# Patient Record
Sex: Female | Born: 1980
Health system: Southern US, Community
[De-identification: ages and names within clinical notes are randomized; demographics above are authoritative.]

## PROBLEM LIST (undated history)

## (undated) DIAGNOSIS — B20 Human immunodeficiency virus [HIV] disease: Secondary | ICD-10-CM

## (undated) HISTORY — DX: Human immunodeficiency virus (HIV) disease: B20

## (undated) HISTORY — PX: NO PAST SURGERIES: SHX2092

---

## 2015-11-15 ENCOUNTER — Other Ambulatory Visit: Payer: Self-pay | Admitting: Infectious Disease

## 2015-11-15 ENCOUNTER — Ambulatory Visit
Admission: RE | Admit: 2015-11-15 | Discharge: 2015-11-15 | Disposition: A | Discharge status: Home / Self Care | Payer: No Typology Code available for payment source | Source: Ambulatory Visit | Attending: Infectious Disease | Admitting: Infectious Disease

## 2015-11-15 DIAGNOSIS — Z139 Encounter for screening, unspecified: Secondary | ICD-10-CM

## 2018-08-12 DIAGNOSIS — B20 Human immunodeficiency virus [HIV] disease: Secondary | ICD-10-CM | POA: Diagnosis not present

## 2018-10-04 DIAGNOSIS — Z3200 Encounter for pregnancy test, result unknown: Secondary | ICD-10-CM | POA: Diagnosis not present

## 2018-10-04 DIAGNOSIS — Z3009 Encounter for other general counseling and advice on contraception: Secondary | ICD-10-CM | POA: Diagnosis not present

## 2018-10-17 DIAGNOSIS — Z3482 Encounter for supervision of other normal pregnancy, second trimester: Secondary | ICD-10-CM | POA: Diagnosis not present

## 2018-10-17 DIAGNOSIS — O98719 Human immunodeficiency virus [HIV] disease complicating pregnancy, unspecified trimester: Secondary | ICD-10-CM | POA: Diagnosis not present

## 2018-10-17 DIAGNOSIS — Z0389 Encounter for observation for other suspected diseases and conditions ruled out: Secondary | ICD-10-CM | POA: Diagnosis not present

## 2018-10-17 DIAGNOSIS — Z23 Encounter for immunization: Secondary | ICD-10-CM | POA: Diagnosis not present

## 2018-10-17 DIAGNOSIS — Z1388 Encounter for screening for disorder due to exposure to contaminants: Secondary | ICD-10-CM | POA: Diagnosis not present

## 2018-10-17 DIAGNOSIS — Z789 Other specified health status: Secondary | ICD-10-CM | POA: Diagnosis not present

## 2018-10-17 DIAGNOSIS — Z3009 Encounter for other general counseling and advice on contraception: Secondary | ICD-10-CM | POA: Diagnosis not present

## 2018-10-17 LAB — OB RESULTS CONSOLE HGB/HCT, BLOOD
HEMATOCRIT: 38 (ref 29–41)
Hemoglobin: 12.1

## 2018-10-17 LAB — OB RESULTS CONSOLE ABO/RH: RH TYPE: POSITIVE

## 2018-10-17 LAB — DRUG SCREEN, URINE: Drug Screen, Urine: NEGATIVE

## 2018-10-17 LAB — CYSTIC FIBROSIS MUTATION 97: Cystic Fibrosis Mutat: NEGATIVE

## 2018-10-17 LAB — CYTOLOGY - PAP: Pap: NEGATIVE

## 2018-10-17 LAB — GLUCOSE, 1 HOUR: Glucose 1 Hour: 80

## 2018-10-17 LAB — OB RESULTS CONSOLE ANTIBODY SCREEN: Antibody Screen: NEGATIVE

## 2018-10-17 LAB — OB RESULTS CONSOLE PLATELET COUNT: Platelets: 239

## 2018-10-17 LAB — OB RESULTS CONSOLE GC/CHLAMYDIA
CHLAMYDIA, DNA PROBE: NEGATIVE
GC PROBE AMP, GENITAL: NEGATIVE

## 2018-10-17 LAB — CULTURE, OB URINE: Urine Culture, OB: NO GROWTH

## 2018-10-17 LAB — OB RESULTS CONSOLE RPR: RPR: NONREACTIVE

## 2018-11-01 ENCOUNTER — Encounter: Payer: Self-pay | Admitting: *Deleted

## 2018-11-03 ENCOUNTER — Encounter: Payer: Self-pay | Admitting: Obstetrics and Gynecology

## 2018-11-03 ENCOUNTER — Encounter: Payer: Self-pay | Admitting: *Deleted

## 2018-11-03 ENCOUNTER — Ambulatory Visit (INDEPENDENT_AMBULATORY_CARE_PROVIDER_SITE_OTHER): Payer: Medicaid Other | Admitting: Obstetrics and Gynecology

## 2018-11-03 VITALS — BP 98/80 | HR 67 | Ht 65.5 in | Wt 169.7 lb

## 2018-11-03 DIAGNOSIS — O09899 Supervision of other high risk pregnancies, unspecified trimester: Secondary | ICD-10-CM | POA: Insufficient documentation

## 2018-11-03 DIAGNOSIS — O0990 Supervision of high risk pregnancy, unspecified, unspecified trimester: Secondary | ICD-10-CM

## 2018-11-03 DIAGNOSIS — Z789 Other specified health status: Secondary | ICD-10-CM | POA: Insufficient documentation

## 2018-11-03 DIAGNOSIS — O09892 Supervision of other high risk pregnancies, second trimester: Secondary | ICD-10-CM

## 2018-11-03 DIAGNOSIS — O09522 Supervision of elderly multigravida, second trimester: Secondary | ICD-10-CM

## 2018-11-03 DIAGNOSIS — O09529 Supervision of elderly multigravida, unspecified trimester: Secondary | ICD-10-CM | POA: Insufficient documentation

## 2018-11-03 NOTE — Progress Notes (Signed)
Referral form sent to [facility].

## 2018-11-03 NOTE — Patient Instructions (Signed)
 Second Trimester of Pregnancy The second trimester is from week 14 through week 27 (months 4 through 6). The second trimester is often a time when you feel your best. Your body has adjusted to being pregnant, and you begin to feel better physically. Usually, morning sickness has lessened or quit completely, you may have more energy, and you may have an increase in appetite. The second trimester is also a time when the fetus is growing rapidly. At the end of the sixth month, the fetus is about 9 inches long and weighs about 1 pounds. You will likely begin to feel the baby move (quickening) between 16 and 20 weeks of pregnancy. Body changes during your second trimester Your body continues to go through many changes during your second trimester. The changes vary from woman to woman.  Your weight will continue to increase. You will notice your lower abdomen bulging out.  You may begin to get stretch marks on your hips, abdomen, and breasts.  You may develop headaches that can be relieved by medicines. The medicines should be approved by your health care provider.  You may urinate more often because the fetus is pressing on your bladder.  You may develop or continue to have heartburn as a result of your pregnancy.  You may develop constipation because certain hormones are causing the muscles that push waste through your intestines to slow down.  You may develop hemorrhoids or swollen, bulging veins (varicose veins).  You may have back pain. This is caused by: ? Weight gain. ? Pregnancy hormones that are relaxing the joints in your pelvis. ? A shift in weight and the muscles that support your balance.  Your breasts will continue to grow and they will continue to become tender.  Your gums may bleed and may be sensitive to brushing and flossing.  Dark spots or blotches (chloasma, mask of pregnancy) may develop on your face. This will likely fade after the baby is born.  A dark line from  your belly button to the pubic area (linea nigra) may appear. This will likely fade after the baby is born.  You may have changes in your hair. These can include thickening of your hair, rapid growth, and changes in texture. Some women also have hair loss during or after pregnancy, or hair that feels dry or thin. Your hair will most likely return to normal after your baby is born. What to expect at prenatal visits During a routine prenatal visit:  You will be weighed to make sure you and the fetus are growing normally.  Your blood pressure will be taken.  Your abdomen will be measured to track your baby's growth.  The fetal heartbeat will be listened to.  Any test results from the previous visit will be discussed. Your health care provider may ask you:  How you are feeling.  If you are feeling the baby move.  If you have had any abnormal symptoms, such as leaking fluid, bleeding, severe headaches, or abdominal cramping.  If you are using any tobacco products, including cigarettes, chewing tobacco, and electronic cigarettes.  If you have any questions. Other tests that may be performed during your second trimester include:  Blood tests that check for: ? Low iron levels (anemia). ? High blood sugar that affects pregnant women (gestational diabetes) between 24 and 28 weeks. ? Rh antibodies. This is to check for a protein on red blood cells (Rh factor).  Urine tests to check for infections, diabetes, or protein in   the urine.  An ultrasound to confirm the proper growth and development of the baby.  An amniocentesis to check for possible genetic problems.  Fetal screens for spina bifida and Down syndrome.  HIV (human immunodeficiency virus) testing. Routine prenatal testing includes screening for HIV, unless you choose not to have this test. Follow these instructions at home: Medicines  Follow your health care provider's instructions regarding medicine use. Specific medicines  may be either safe or unsafe to take during pregnancy.  Take a prenatal vitamin that contains at least 600 micrograms (mcg) of folic acid.  If you develop constipation, try taking a stool softener if your health care provider approves. Eating and drinking   Eat a balanced diet that includes fresh fruits and vegetables, whole grains, good sources of protein such as meat, eggs, or tofu, and low-fat dairy. Your health care provider will help you determine the amount of weight gain that is right for you.  Avoid raw meat and uncooked cheese. These carry germs that can cause birth defects in the baby.  If you have low calcium intake from food, talk to your health care provider about whether you should take a daily calcium supplement.  Limit foods that are high in fat and processed sugars, such as fried and sweet foods.  To prevent constipation: ? Drink enough fluid to keep your urine clear or pale yellow. ? Eat foods that are high in fiber, such as fresh fruits and vegetables, whole grains, and beans. Activity  Exercise only as directed by your health care provider. Most women can continue their usual exercise routine during pregnancy. Try to exercise for 30 minutes at least 5 days a week. Stop exercising if you experience uterine contractions.  Avoid heavy lifting, wear low heel shoes, and practice good posture.  A sexual relationship may be continued unless your health care provider directs you otherwise. Relieving pain and discomfort  Wear a good support bra to prevent discomfort from breast tenderness.  Take warm sitz baths to soothe any pain or discomfort caused by hemorrhoids. Use hemorrhoid cream if your health care provider approves.  Rest with your legs elevated if you have leg cramps or low back pain.  If you develop varicose veins, wear support hose. Elevate your feet for 15 minutes, 3-4 times a day. Limit salt in your diet. Prenatal Care  Write down your questions. Take  them to your prenatal visits.  Keep all your prenatal visits as told by your health care provider. This is important. Safety  Wear your seat belt at all times when driving.  Make a list of emergency phone numbers, including numbers for family, friends, the hospital, and police and fire departments. General instructions  Ask your health care provider for a referral to a local prenatal education class. Begin classes no later than the beginning of month 6 of your pregnancy.  Ask for help if you have counseling or nutritional needs during pregnancy. Your health care provider can offer advice or refer you to specialists for help with various needs.  Do not use hot tubs, steam rooms, or saunas.  Do not douche or use tampons or scented sanitary pads.  Do not cross your legs for long periods of time.  Avoid cat litter boxes and soil used by cats. These carry germs that can cause birth defects in the baby and possibly loss of the fetus by miscarriage or stillbirth.  Avoid all smoking, herbs, alcohol, and unprescribed drugs. Chemicals in these products can affect the   formation and growth of the baby.  Do not use any products that contain nicotine or tobacco, such as cigarettes and e-cigarettes. If you need help quitting, ask your health care provider.  Visit your dentist if you have not gone yet during your pregnancy. Use a soft toothbrush to brush your teeth and be gentle when you floss. Contact a health care provider if:  You have dizziness.  You have mild pelvic cramps, pelvic pressure, or nagging pain in the abdominal area.  You have persistent nausea, vomiting, or diarrhea.  You have a bad smelling vaginal discharge.  You have pain when you urinate. Get help right away if:  You have a fever.  You are leaking fluid from your vagina.  You have spotting or bleeding from your vagina.  You have severe abdominal cramping or pain.  You have rapid weight gain or weight loss.  You  have shortness of breath with chest pain.  You notice sudden or extreme swelling of your face, hands, ankles, feet, or legs.  You have not felt your baby move in over an hour.  You have severe headaches that do not go away when you take medicine.  You have vision changes. Summary  The second trimester is from week 14 through week 27 (months 4 through 6). It is also a time when the fetus is growing rapidly.  Your body goes through many changes during pregnancy. The changes vary from woman to woman.  Avoid all smoking, herbs, alcohol, and unprescribed drugs. These chemicals affect the formation and growth your baby.  Do not use any tobacco products, such as cigarettes, chewing tobacco, and e-cigarettes. If you need help quitting, ask your health care provider.  Contact your health care provider if you have any questions. Keep all prenatal visits as told by your health care provider. This is important. This information is not intended to replace advice given to you by your health care provider. Make sure you discuss any questions you have with your health care provider. Document Released: 10/06/2001 Document Revised: 11/17/2016 Document Reviewed: 11/17/2016 Elsevier Interactive Patient Education  2019 [employer].   Contraception Choices Contraception, also called birth control, refers to methods or devices that prevent pregnancy. Hormonal methods Contraceptive implant  A contraceptive implant is a thin, plastic tube that contains a hormone. It is inserted into the upper part of the arm. It can remain in place for up to 3 years. Progestin-only injections Progestin-only injections are injections of progestin, a synthetic form of the hormone progesterone. They are given every 3 months by a health care provider. Birth control pills  Birth control pills are pills that contain hormones that prevent pregnancy. They must be taken once a day, preferably at the same time each day. Birth  control patch  The birth control patch contains hormones that prevent pregnancy. It is placed on the skin and must be changed once a week for three weeks and removed on the fourth week. A prescription is needed to use this method of contraception. Vaginal ring  A vaginal ring contains hormones that prevent pregnancy. It is placed in the vagina for three weeks and removed on the fourth week. After that, the process is repeated with a new ring. A prescription is needed to use this method of contraception. Emergency contraceptive Emergency contraceptives prevent pregnancy after unprotected sex. They come in pill form and can be taken up to 5 days after sex. They work best the sooner they are taken after having sex. Most  emergency contraceptives are available without a prescription. This method should not be used as your only form of birth control. Barrier methods Female condom  A female condom is a thin sheath that is worn over the penis during sex. Condoms keep sperm from going inside a woman's body. They can be used with a spermicide to increase their effectiveness. They should be disposed after a single use. Female condom  A female condom is a soft, loose-fitting sheath that is put into the vagina before sex. The condom keeps sperm from going inside a woman's body. They should be disposed after a single use. Diaphragm  A diaphragm is a soft, dome-shaped barrier. It is inserted into the vagina before sex, along with a spermicide. The diaphragm blocks sperm from entering the uterus, and the spermicide kills sperm. A diaphragm should be left in the vagina for 6-8 hours after sex and removed within 24 hours. A diaphragm is prescribed and fitted by a health care provider. A diaphragm should be replaced every 1-2 years, after giving birth, after gaining more than 15 lb (6.8 kg), and after pelvic surgery. Cervical cap  A cervical cap is a round, soft latex or plastic cup that fits over the cervix. It is  inserted into the vagina before sex, along with spermicide. It blocks sperm from entering the uterus. The cap should be left in place for 6-8 hours after sex and removed within 48 hours. A cervical cap must be prescribed and fitted by a health care provider. It should be replaced every 2 years. Sponge  A sponge is a soft, circular piece of polyurethane foam with spermicide on it. The sponge helps block sperm from entering the uterus, and the spermicide kills sperm. To use it, you make it wet and then insert it into the vagina. It should be inserted before sex, left in for at least 6 hours after sex, and removed and thrown away within 30 hours. Spermicides Spermicides are chemicals that kill or block sperm from entering the cervix and uterus. They can come as a cream, jelly, suppository, foam, or tablet. A spermicide should be inserted into the vagina with an applicator at least 93-26 minutes before sex to allow time for it to work. The process must be repeated every time you have sex. Spermicides do not require a prescription. Intrauterine contraception Intrauterine device (IUD) An IUD is a T-shaped device that is put in a woman's uterus. There are two types:  Hormone IUD.This type contains progestin, a synthetic form of the hormone progesterone. This type can stay in place for 3-5 years.  Copper IUD.This type is wrapped in copper wire. It can stay in place for 10 years.  Permanent methods of contraception Female tubal ligation In this method, a woman's fallopian tubes are sealed, tied, or blocked during surgery to prevent eggs from traveling to the uterus. Hysteroscopic sterilization In this method, a small, flexible insert is placed into each fallopian tube. The inserts cause scar tissue to form in the fallopian tubes and block them, so sperm cannot reach an egg. The procedure takes about 3 months to be effective. Another form of birth control must be used during those 3 months. Female  sterilization This is a procedure to tie off the tubes that carry sperm (vasectomy). After the procedure, the man can still ejaculate fluid (semen). Natural planning methods Natural family planning In this method, a couple does not have sex on days when the woman could become pregnant. Calendar method This means keeping  wire. It can stay in place for 10 years.    Permanent methods of contraception  Female tubal ligation  In this method, a woman's fallopian tubes are sealed, tied, or blocked during surgery to prevent eggs from traveling to the uterus.  Hysteroscopic sterilization  In this method, a small, flexible insert is placed into each fallopian tube. The inserts cause scar tissue to form in the fallopian tubes and block them, so sperm cannot reach an egg. The procedure takes about 3 months to be effective. Another form of birth control must be used during those 3 months.  Female sterilization  This  is a procedure to tie off the tubes that carry sperm (vasectomy). After the procedure, the man can still ejaculate fluid (semen).  Natural planning methods  Natural family planning  In this method, a couple does not have sex on days when the woman could become pregnant.  Calendar method  This means keeping track of the length of each menstrual cycle, identifying the days when pregnancy can happen, and not having sex on those days.  Ovulation method  In this method, a couple avoids sex during ovulation.  Symptothermal method  This method involves not having sex during ovulation. The woman typically checks for ovulation by watching changes in her temperature and in the consistency of cervical mucus.  Post-ovulation method  In this method, a couple waits to have sex until after ovulation.  Summary  · Contraception, also called birth control, means methods or devices that prevent pregnancy.  · Hormonal methods of contraception include implants, injections, pills, patches, vaginal rings, and emergency contraceptives.  · Barrier methods of contraception can include female condoms, female condoms, diaphragms, cervical caps, sponges, and spermicides.  · There are two types of IUDs (intrauterine devices). An IUD can be put in a woman's uterus to prevent pregnancy for 3-5 years.  · Permanent sterilization can be done through a procedure for males, females, or both.  · Natural family planning methods involve not having sex on days when the woman could become pregnant.  This information is not intended to replace advice given to you by your health care provider. Make sure you discuss any questions you have with your health care provider.  Document Released: 10/12/2005 Document Revised: 10/14/2017 Document Reviewed: 11/14/2016  Elsevier Interactive Patient Education © 2019 [employer] Inc[employer]

## 2018-11-03 NOTE — Progress Notes (Signed)
Anatomy ultrasound and MFM consult scheduled for 11/17/18 @ 0800.

## 2018-11-03 NOTE — Progress Notes (Signed)
  Subjective:    Marcia Long is a [id number] [redacted]w[redacted]d being seen today for her first obstetrical visit.  Her obstetrical history is significant for AMA, grand multiparity HIV followed in [facility] (patient last seen 6 month ago). Patient does not intend to breast feed. Pregnancy history fully reviewed.  Patient reports no complaints.  Vitals:   11/03/18 1459 11/03/18 1502  BP: 98/80   Pulse: 67   Weight: 169 lb 11.2 oz (77 kg)   Height:  5' 5.5" (1.664 m)    HISTORY: OB History  Gravida Para Term Preterm AB Living  6 5 5     5   SAB TAB Ectopic Multiple Live Births          5    # Outcome Date GA Lbr Len/2nd Weight Sex Delivery Anes PTL Lv  6 Current           5 Term 10/22/14     Vag-Spont   LIV  4 Term 08/03/11     Vag-Spont   LIV  3 Term 05/02/08     Vag-Spont   LIV  2 Term 10/23/07     Vag-Spont   LIV  1 Term 03/18/05     Vag-Spont   LIV   Past Medical History:  Diagnosis Date  . HIV (human immunodeficiency virus infection) (HCC)    History reviewed. No pertinent surgical history. History reviewed. No pertinent family history.   Exam      Assessment:    Pregnancy: [id number] Patient Active Problem List   Diagnosis Date Noted  . Supervision of high risk pregnancy, antepartum 11/03/2018  . HIV (human immunodeficiency virus) risk factors complicating pregnancy 11/03/2018  . Language barrier 11/03/2018  . AMA (advanced maternal age) multigravida 35+ 11/03/2018        Plan:     Initial labs drawn. Prenatal vitamins. Problem list reviewed and updated. Genetic Screening discussed : patient with abnormal quad screen. Panorama today  Ultrasound discussed; fetal survey: ordered, along with genetic counseling Patient instructed to contact ID physician to inform of pregnancy status. Patient desires to transfer care to ID in [facility] due to transportation Patient desires permanent sterilization  Follow up in 4 weeks. 50% of 30 min visit spent on counseling and  coordination of care.     Marcia Long 11/03/2018

## 2018-11-07 ENCOUNTER — Encounter: Payer: Self-pay | Admitting: *Deleted

## 2018-11-10 ENCOUNTER — Encounter (HOSPITAL_COMMUNITY): Payer: Self-pay

## 2018-11-15 ENCOUNTER — Other Ambulatory Visit: Payer: Medicaid Other

## 2018-11-15 ENCOUNTER — Other Ambulatory Visit (HOSPITAL_COMMUNITY)
Admission: RE | Admit: 2018-11-15 | Discharge: 2018-11-15 | Disposition: A | Discharge status: Home / Self Care | Payer: Medicaid Other | Source: Ambulatory Visit | Attending: Infectious Diseases | Admitting: Infectious Diseases

## 2018-11-15 ENCOUNTER — Ambulatory Visit: Payer: Medicaid Other

## 2018-11-15 ENCOUNTER — Encounter: Payer: Self-pay | Admitting: Family

## 2018-11-15 ENCOUNTER — Other Ambulatory Visit: Payer: Self-pay

## 2018-11-15 DIAGNOSIS — Z79899 Other long term (current) drug therapy: Secondary | ICD-10-CM

## 2018-11-15 DIAGNOSIS — Z113 Encounter for screening for infections with a predominantly sexual mode of transmission: Secondary | ICD-10-CM | POA: Diagnosis not present

## 2018-11-15 DIAGNOSIS — B20 Human immunodeficiency virus [HIV] disease: Secondary | ICD-10-CM | POA: Diagnosis not present

## 2018-11-15 LAB — URINALYSIS
Bilirubin Urine: NEGATIVE
GLUCOSE, UA: NEGATIVE
Hgb urine dipstick: NEGATIVE
Ketones, ur: NEGATIVE
Leukocytes, UA: NEGATIVE
Nitrite: NEGATIVE
Protein, ur: NEGATIVE
Specific Gravity, Urine: 1.01 (ref 1.001–1.03)
pH: 7 (ref 5.0–8.0)

## 2018-11-16 LAB — URINE CYTOLOGY ANCILLARY ONLY
Chlamydia: NEGATIVE
Neisseria Gonorrhea: NEGATIVE

## 2018-11-17 ENCOUNTER — Ambulatory Visit (HOSPITAL_COMMUNITY): Admission: RE | Admit: 2018-11-17 | Payer: Medicaid Other | Source: Ambulatory Visit

## 2018-11-17 ENCOUNTER — Encounter (HOSPITAL_COMMUNITY): Payer: Self-pay

## 2018-11-17 ENCOUNTER — Other Ambulatory Visit (HOSPITAL_COMMUNITY): Payer: Self-pay | Admitting: *Deleted

## 2018-11-17 ENCOUNTER — Ambulatory Visit (HOSPITAL_COMMUNITY)
Admission: RE | Admit: 2018-11-17 | Discharge: 2018-11-17 | Disposition: A | Discharge status: Home / Self Care | Payer: Medicaid Other | Source: Ambulatory Visit | Attending: Obstetrics and Gynecology | Admitting: Obstetrics and Gynecology

## 2018-11-17 DIAGNOSIS — Z3A25 25 weeks gestation of pregnancy: Secondary | ICD-10-CM | POA: Diagnosis not present

## 2018-11-17 DIAGNOSIS — Z362 Encounter for other antenatal screening follow-up: Secondary | ICD-10-CM

## 2018-11-17 DIAGNOSIS — O0932 Supervision of pregnancy with insufficient antenatal care, second trimester: Secondary | ICD-10-CM

## 2018-11-17 DIAGNOSIS — O09522 Supervision of elderly multigravida, second trimester: Secondary | ICD-10-CM | POA: Insufficient documentation

## 2018-11-17 DIAGNOSIS — O98712 Human immunodeficiency virus [HIV] disease complicating pregnancy, second trimester: Secondary | ICD-10-CM | POA: Diagnosis not present

## 2018-11-17 DIAGNOSIS — Z363 Encounter for antenatal screening for malformations: Secondary | ICD-10-CM

## 2018-11-17 DIAGNOSIS — O289 Unspecified abnormal findings on antenatal screening of mother: Secondary | ICD-10-CM | POA: Diagnosis not present

## 2018-11-17 DIAGNOSIS — O09892 Supervision of other high risk pregnancies, second trimester: Secondary | ICD-10-CM | POA: Diagnosis not present

## 2018-11-17 LAB — T-HELPER CELL (CD4) - (RCID CLINIC ONLY)
CD4 % Helper T Cell: 43 % (ref 33–55)
CD4 T CELL ABS: 800 /uL (ref 400–2700)

## 2018-11-17 NOTE — ED Notes (Signed)
[place] interpreter unavailable, (live, video or audio) for visit.

## 2018-11-23 LAB — CBC WITH DIFFERENTIAL/PLATELET
Absolute Monocytes: 515 cells/uL (ref 200–950)
Basophils Absolute: 42 cells/uL (ref 0–200)
Basophils Relative: 0.8 %
Eosinophils Absolute: 270 cells/uL (ref 15–500)
Eosinophils Relative: 5.2 %
HCT: 38.7 % (ref 35.0–45.0)
Hemoglobin: 12.9 g/dL (ref 11.7–15.5)
Lymphs Abs: 1789 cells/uL (ref 850–3900)
MCH: 29.4 pg (ref 27.0–33.0)
MCHC: 33.3 g/dL (ref 32.0–36.0)
MCV: 88.2 fL (ref 80.0–100.0)
MPV: 11.3 fL (ref 7.5–12.5)
Monocytes Relative: 9.9 %
NEUTROS PCT: 49.7 %
Neutro Abs: 2584 cells/uL (ref 1500–7800)
Platelets: 219 10*3/uL (ref 140–400)
RBC: 4.39 10*6/uL (ref 3.80–5.10)
RDW: 13.5 % (ref 11.0–15.0)
TOTAL LYMPHOCYTE: 34.4 %
WBC: 5.2 10*3/uL (ref 3.8–10.8)

## 2018-11-23 LAB — HEPATITIS B CORE ANTIBODY, TOTAL: Hep B Core Total Ab: REACTIVE — AB

## 2018-11-23 LAB — QUANTIFERON-TB GOLD PLUS
Mitogen-NIL: >10 IU/mL
NIL: 0.09 IU/mL
QuantiFERON-TB Gold Plus: NEGATIVE
TB1-NIL: 0.1 IU/mL
TB2-NIL: 0.09 IU/mL

## 2018-11-23 LAB — HIV-1 RNA ULTRAQUANT REFLEX TO GENTYP+
HIV 1 RNA Quant: <20 copies/mL
HIV-1 RNA Quant, Log: <1.3 Log copies/mL

## 2018-11-23 LAB — COMPLETE METABOLIC PANEL WITH GFR
AG RATIO: 1.1 (calc) (ref 1.0–2.5)
ALT: 15 U/L (ref 6–29)
AST: 12 U/L (ref 10–30)
Albumin: 3.5 g/dL — ABNORMAL LOW (ref 3.6–5.1)
Alkaline phosphatase (APISO): 44 U/L (ref 33–115)
BUN: 8 mg/dL (ref 7–25)
CO2: 26 mmol/L (ref 20–32)
Calcium: 8.9 mg/dL (ref 8.6–10.2)
Chloride: 105 mmol/L (ref 98–110)
Creat: 0.63 mg/dL (ref 0.50–1.10)
GFR, Est African American: 132 mL/min/{1.73_m2} (ref >=60)
GFR, Est Non African American: 114 mL/min/{1.73_m2} (ref >=60)
Globulin: 3.2 g/dL (calc) (ref 1.9–3.7)
Glucose, Bld: 78 mg/dL (ref 65–99)
POTASSIUM: 4 mmol/L (ref 3.5–5.3)
Sodium: 138 mmol/L (ref 135–146)
Total Bilirubin: 0.3 mg/dL (ref 0.2–1.2)
Total Protein: 6.7 g/dL (ref 6.1–8.1)

## 2018-11-23 LAB — RPR: RPR Ser Ql: NONREACTIVE

## 2018-11-23 LAB — HEPATITIS B SURFACE ANTIBODY,QUALITATIVE: Hep B S Ab: REACTIVE — AB

## 2018-11-23 LAB — HLA B*5701: HLA-B*5701 w/rflx HLA-B High: NEGATIVE

## 2018-11-23 LAB — LIPID PANEL
Cholesterol: 185 mg/dL (ref <200)
HDL: 75 mg/dL (ref >50)
LDL Cholesterol (Calc): 92 mg/dL (calc)
Non-HDL Cholesterol (Calc): 110 mg/dL (calc) (ref <130)
Total CHOL/HDL Ratio: 2.5 (calc) (ref <5.0)
Triglycerides: 88 mg/dL (ref <150)

## 2018-11-23 LAB — HEPATITIS B SURFACE ANTIGEN: HEP B S AG: NONREACTIVE

## 2018-11-23 LAB — HEPATITIS A ANTIBODY, TOTAL: Hepatitis A AB,Total: REACTIVE — AB

## 2018-11-23 LAB — HIV-1/2 AB - DIFFERENTIATION
HIV-1 antibody: POSITIVE — AB
HIV-2 Ab: NEGATIVE

## 2018-11-23 LAB — HIV ANTIBODY (ROUTINE TESTING W REFLEX): HIV 1&2 Ab, 4th Generation: REACTIVE — AB

## 2018-11-23 LAB — HEPATITIS C ANTIBODY
Hepatitis C Ab: NONREACTIVE
SIGNAL TO CUT-OFF: 0.03 (ref <1.00)

## 2018-11-25 DIAGNOSIS — B20 Human immunodeficiency virus [HIV] disease: Secondary | ICD-10-CM | POA: Diagnosis not present

## 2018-11-28 ENCOUNTER — Telehealth: Payer: Self-pay | Admitting: *Deleted

## 2018-11-28 NOTE — Telephone Encounter (Signed)
Pt left message stating her name and DOB.  She did not state the reason for her call.

## 2018-11-29 NOTE — Telephone Encounter (Signed)
Called patient twice phone rang no answer.

## 2018-11-30 ENCOUNTER — Ambulatory Visit (INDEPENDENT_AMBULATORY_CARE_PROVIDER_SITE_OTHER): Payer: Medicaid Other | Admitting: Pharmacist

## 2018-11-30 ENCOUNTER — Encounter: Payer: Self-pay | Admitting: Infectious Diseases

## 2018-11-30 ENCOUNTER — Ambulatory Visit (INDEPENDENT_AMBULATORY_CARE_PROVIDER_SITE_OTHER): Payer: Medicaid Other | Admitting: Infectious Diseases

## 2018-11-30 VITALS — BP 94/55 | HR 61 | Temp 97.8°F | Wt 172.0 lb

## 2018-11-30 DIAGNOSIS — B20 Human immunodeficiency virus [HIV] disease: Secondary | ICD-10-CM

## 2018-11-30 DIAGNOSIS — Z79899 Other long term (current) drug therapy: Secondary | ICD-10-CM | POA: Diagnosis not present

## 2018-11-30 DIAGNOSIS — R3915 Urgency of urination: Secondary | ICD-10-CM

## 2018-11-30 DIAGNOSIS — Z21 Asymptomatic human immunodeficiency virus [HIV] infection status: Secondary | ICD-10-CM | POA: Diagnosis not present

## 2018-11-30 DIAGNOSIS — S025XXS Fracture of tooth (traumatic), sequela: Secondary | ICD-10-CM | POA: Diagnosis not present

## 2018-11-30 DIAGNOSIS — Z3A00 Weeks of gestation of pregnancy not specified: Secondary | ICD-10-CM

## 2018-11-30 DIAGNOSIS — R011 Cardiac murmur, unspecified: Secondary | ICD-10-CM | POA: Diagnosis not present

## 2018-11-30 DIAGNOSIS — O98712 Human immunodeficiency virus [HIV] disease complicating pregnancy, second trimester: Secondary | ICD-10-CM | POA: Diagnosis not present

## 2018-11-30 DIAGNOSIS — O09892 Supervision of other high risk pregnancies, second trimester: Secondary | ICD-10-CM

## 2018-11-30 DIAGNOSIS — K0889 Other specified disorders of teeth and supporting structures: Secondary | ICD-10-CM | POA: Diagnosis not present

## 2018-11-30 DIAGNOSIS — G8929 Other chronic pain: Secondary | ICD-10-CM | POA: Insufficient documentation

## 2018-11-30 DIAGNOSIS — K089 Disorder of teeth and supporting structures, unspecified: Secondary | ICD-10-CM

## 2018-11-30 HISTORY — DX: Asymptomatic human immunodeficiency virus (hiv) infection status: Z21

## 2018-11-30 HISTORY — DX: Human immunodeficiency virus (HIV) disease: B20

## 2018-11-30 MED ORDER — EMTRICITABINE-TENOFOVIR DF 200-300 MG PO TABS: 1.0000 | ORAL_TABLET | Freq: Every day | ORAL | 11 refills | Status: DC

## 2018-11-30 MED ORDER — DOLUTEGRAVIR SODIUM 50 MG PO TABS: 50.0000 mg | ORAL_TABLET | Freq: Every day | ORAL | 11 refills | Status: DC

## 2018-11-30 NOTE — Assessment & Plan Note (Addendum)
New patient here to establish for HIV care. I discussed with Marcia Long treatment options/side effects, benefits of treatment and long-term outcomes. I discussed how HIV is transmitted and the process of untreated HIV including increased risk for opportunistic infections, cancer, dementia and renal failure. Patient was counseled on routine HIV care including medication adherence, blood monitoring, necessary vaccines and follow up visits. Patient spent time talking with our pharmacist Cassie regarding successful practices of ART and understands to reach out to our clinic in the future with questions.  We reviewed pregnancy recommendations today and with switch her Odefsey to Tivicay + Truvada once daily for HIV treatment. She has Medicaid - will send to [facility] per request to mail medications. Discussed at length recommendation to formula feed over nurse her child. Will again discuss at upcoming visit. If she does desire to breastfeed still will discuss harm reduction practices further and infant antiretroviral prophylaxis throughout breastfeeding. Presumed risk for acquisition is statistically very low 0.3 - 0.6% transition over 12 months of nursing in the setting of maternal viral suppression and infant ARV proph. I think language/cultural barrier is making this difficult to convince her otherwise.   General introduction to our clinic and integrated services. Introduced to [facility] and [facility] today. Dental referral placed today for [facility]. Information to schedule appointment completed today.   I spent greater than 60 minutes with the patient today. Greater than 70% of the time spent face-to-face counseling and coordination of care re: HIV and health maintenance.

## 2018-11-30 NOTE — Telephone Encounter (Signed)
Returned patients call, because she previoulsy called and left her name and date of birth, patient wanted to know when her appointment was advised her it was tomorrow at 335pm, patient said okay and ended the call

## 2018-11-30 NOTE — Assessment & Plan Note (Signed)
With chart review seems her teeth have been a problem off and on for a few years. She has multiple broken teeth and with current area of complaint a large hole in central molar likely extending down to nerve. There is no acute evidence of infection or abscess presently. Being pregnant complicates this for her I would presume. Discussed cold application to mandible and tylenol. She needs multiple extractions. Will have her set up with our dental team. Language barrier in the past has been a problem to arrange services from what I see.

## 2018-11-30 NOTE — Assessment & Plan Note (Signed)
She is undetectable. Will have her return in 6 week to repeat HIV viral load with med switch. She will return at 34-36 weeks to repeat viral load again to provide reassurance that she can progress with vaginal birth.

## 2018-11-30 NOTE — Progress Notes (Signed)
Name: Marcia Marcia Long  DOB: 12/28/1980 MRN: [id number] PCP: Patient, No Pcp Per    Patient Active Problem List   Diagnosis Date Noted  . HIV (human immunodeficiency virus infection) (HCC) 11/30/2018  . Chronic dental pain 11/30/2018  . Supervision of high risk pregnancy, antepartum 11/03/2018  . HIV (human immunodeficiency virus) risk factors complicating pregnancy 11/03/2018  . Language barrier 11/03/2018  . AMA (advanced maternal age) multigravida 35+ 11/03/2018     Brief Narrative:  Marcia Marcia Long is a 38 y.o. female with HIV disease, dx 2006. [place] 07[place] as refugee. Native language [place]. [facility]. Married, husband HIV+. A refugee. History of OIs: unknown.  Previous Regimens: . Odefsey 2017 >> undetectable  Genotypes: . 2017 chart records indicate no major RT, PI, INSTI mutations  Subjective:  CC:  Establish / transfer HIV care. Currently pregnant in 2nd trimester. Has left tooth pain.   HPI:  Interpretor present during visit.  Currently taking her Odefsey with food daily. She has been on this medication at least since 2017. Has been out x 5d due to delay in shipping medication. She likes the one pill option and has no concern with s/e of medication. She has been on a few other pills in the past but does not recall regimen given to her in [place]. Reports no complaints today suggestive of associated opportunistic infection or advancing HIV disease such as fevers, night sweats, weight loss, anorexia, cough, SOB, nausea, vomiting, diarrhea, headache, sensory changes, lymphadenopathy or oral thrush.    Currently pregnant with 6th child. Appears to be a female per recent ultrasound. She is in her second trimester with EDD 03/01/2019. Not currently taking prenatal vitamins - she tells me that she was told she needs a prescription for this. She is not certain who her doctor is and uncertain as to what her due  date is. She would like to know the gender of her baby as she has not been informed following recent ultrasound. She has 5 daughters at home (per chart one may be HIV+ but she does not offer this). She has had all vaginal deliveries in the past. She plans to breast feed and supplement with formula - she says she was told to not breast feed however there was a "delay in receiving formula" and she had to feed her child.   Complains of tooth pain. Trouble with cold and hot foods/fluids. Lower left jaw. No fevers, drainage or swelling. No access to dentist. Uncertain as to last pap smear. Not sure if she has had abnormal testing in the past.   Review of Systems  Constitutional: Negative for chills, fever, malaise/fatigue and weight loss.  HENT: Negative for sore throat.        Left lower jaw tooth pain  Respiratory: Negative for cough, sputum production and shortness of breath.   Cardiovascular: Negative.   Gastrointestinal: Negative for abdominal pain, diarrhea and vomiting.  Genitourinary: Positive for urgency. Negative for dysuria.  Musculoskeletal: Negative for joint pain, myalgias and neck pain.  Skin: Negative for rash.  Neurological: Negative for headaches.    Past Medical History:  Diagnosis Date  . HIV (human immunodeficiency virus infection) (HCC)     Outpatient Medications Prior to Visit  Medication Sig Dispense Refill  . emtricitabine-rilpivir-tenofovir AF (ODEFSEY) 200-25-25 MG TABS tablet TAKE 1 TABLET BY MOUTH DAILY    . prenatal vitamin w/FE, FA (PRENATAL 1 + 1) 27-1 MG TABS tablet  Take 1 tablet by mouth daily at 12 noon.     No facility-administered medications prior to visit.      No Known Allergies  Social History   Tobacco Use  . Smoking status: Never Smoker  . Smokeless tobacco: Never Used  Substance Use Topics  . Alcohol use: Not Currently  . Drug use: Not Currently    No family history on file.  Social History   Substance and Sexual Activity  Sexual  Activity Yes  . Birth control/protection: None     Objective:   Vitals:   11/30/18 1409  BP: (!) 94/55  Pulse: 61  Temp: 97.8 F (36.6 C)  Weight: 172 lb (78 kg)   Body mass index is 28.19 kg/m.  Physical Exam Vitals signs reviewed.  Constitutional:      Appearance: She is well-developed.     Comments: Seated comfortably in chair.   HENT:     Head:     Jaw: Tenderness present. No trismus.     Mouth/Throat:     Mouth: Mucous membranes are moist. No oral lesions.     Dentition: Abnormal dentition. Dental tenderness and dental caries present. No dental abscesses.     Pharynx: No oropharyngeal exudate.     Tonsils: No tonsillar exudate.      Comments: Multiple broken teeth. Large defect/hole in left lower molar. No abscess or gingival erythema.  Cardiovascular:     Rate and Rhythm: Normal rate and regular rhythm.     Heart sounds: Murmur (soft systolic murmur ) present.  Pulmonary:     Effort: Pulmonary effort is normal.     Breath sounds: Normal breath sounds.  Abdominal:     General: There is no distension.     Palpations: Abdomen is soft.     Tenderness: There is no abdominal tenderness.     Comments: Pregnant. Fundus at umbilicus.   Musculoskeletal:        General: No swelling.  Lymphadenopathy:     Cervical: No cervical adenopathy.  Skin:    General: Skin is warm and dry.     Capillary Refill: Capillary refill takes less than 2 seconds.     Findings: No rash.  Neurological:     Mental Status: She is alert and oriented to person, place, and time.  Psychiatric:        Judgment: Judgment normal.     Comments: In good spirits today and engaged in care discussion     Lab Results Lab Results  Component Value Date   WBC 5.2 11/15/2018   HGB 12.9 11/15/2018   HCT 38.7 11/15/2018   MCV 88.2 11/15/2018   PLT 219 11/15/2018    Lab Results  Component Value Date   CREATININE 0.63 11/15/2018   BUN 8 11/15/2018   NA 138 11/15/2018   K 4.0 11/15/2018   CL  105 11/15/2018   CO2 26 11/15/2018    Lab Results  Component Value Date   ALT 15 11/15/2018   AST 12 11/15/2018   BILITOT 0.3 11/15/2018    Lab Results  Component Value Date   CHOL 185 11/15/2018   HDL 75 11/15/2018   LDLCALC 92 11/15/2018   TRIG 88 11/15/2018   CHOLHDL 2.5 11/15/2018   HIV 1 RNA Quant (copies/mL)  Date Value  11/15/2018 <20 NOT DETECTED   CD4 T Cell Abs (/uL)  Date Value  11/15/2018 800     Assessment & Plan:   Problem List Items Addressed This Visit  Unprioritized   Chronic dental pain    With chart review seems her teeth have been a problem off and on for a few years. She has multiple broken teeth and with current area of complaint a large hole in central molar likely extending down to nerve. There is no acute evidence of infection or abscess presently. Being pregnant complicates this for her I would presume. Discussed cold application to mandible and tylenol. She needs multiple extractions. Will have her set up with our dental team. Language barrier in the past has been a problem to arrange services from what I see.       HIV (human immunodeficiency virus infection) (HCC)    New patient here to establish for HIV care. I discussed with Marcia Long treatment options/side effects, benefits of treatment and long-term outcomes. I discussed how HIV is transmitted and the process of untreated HIV including increased risk for opportunistic infections, cancer, dementia and renal failure. Patient was counseled on routine HIV care including medication adherence, blood monitoring, necessary vaccines and follow up visits. Patient spent time talking with our pharmacist Cassie regarding successful practices of ART and understands to reach out to our clinic in the future with questions.  We reviewed pregnancy recommendations today and with switch her Odefsey to Tivicay + Truvada once daily for HIV treatment. She has Medicaid - [facility] request to mail medications. Discussed at length recommendation to formula feed over nurse her child. Will again discuss at upcoming visit. If she does desire to breastfeed still will discuss harm reduction practices further and infant antiretroviral prophylaxis throughout breastfeeding. Presumed risk for acquisition is statistically very low 0.3 - 0.6% transition over 12 months of nursing in the setting of maternal viral suppression and infant ARV proph. I think language/cultural barrier is making this difficult to convince her otherwise.   General introduction to our clinic and integrated services. Introduced to [facility] and [facility] today. [facility] placed [facility]. Information to schedule appointment completed today.   I spent greater than 60 minutes with the patient today. Greater than 70% of the time spent face-to-face counseling and coordination of care re: HIV and health maintenance.        Relevant Medications   emtricitabine-rilpivir-tenofovir AF (ODEFSEY) 200-25-25 MG TABS tablet   dolutegravir (TIVICAY) 50 MG tablet   emtricitabine-tenofovir (TRUVADA) 200-300 MG tablet   HIV (human immunodeficiency virus) risk factors complicating pregnancy    She is undetectable. Will have her return in 6 week to repeat HIV viral load with med switch. She will return at 34-36 weeks to repeat viral load again to provide reassurance that she can progress with vaginal birth.        Other Visit Diagnoses    Human immunodeficiency virus (HIV) disease (HCC)    -  Primary   Relevant Medications   emtricitabine-rilpivir-tenofovir AF (ODEFSEY) 200-25-25 MG TABS tablet   dolutegravir (TIVICAY) 50 MG tablet   emtricitabine-tenofovir (TRUVADA) 200-300 MG tablet      Rexene AlbertsStephanie Deyonte Cadden, MSN, NP-C [facility] Pager: [phone] Office: [phone]  11/30/18  10:33 PM

## 2018-11-30 NOTE — Progress Notes (Signed)
HPI: Marcia Long is a 38 y.o. female who presents to the [facility] clinic today to initiate care with [facility] for her HIV infection.  Patient Active Problem List   Diagnosis Date Noted  . Supervision of high risk pregnancy, antepartum 11/03/2018  . HIV (human immunodeficiency virus) risk factors complicating pregnancy 11/03/2018  . Language barrier 11/03/2018  . AMA (advanced maternal age) multigravida 35+ 11/03/2018    Patient's Medications  New Prescriptions   No medications on file  Previous Medications   DOLUTEGRAVIR (TIVICAY) 50 MG TABLET    Take 1 tablet (50 mg total) by mouth daily.   EMTRICITABINE-RILPIVIR-TENOFOVIR AF (ODEFSEY) 200-25-25 MG TABS TABLET    TAKE 1 TABLET BY MOUTH DAILY   EMTRICITABINE-TENOFOVIR (TRUVADA) 200-300 MG TABLET    Take 1 tablet by mouth daily.   PRENATAL VITAMIN W/FE, FA (PRENATAL 1 + 1) 27-1 MG TABS TABLET    Take 1 tablet by mouth daily at 12 noon.  Modified Medications   No medications on file  Discontinued Medications   No medications on file    Allergies: No Known Allergies  Past Medical History: Past Medical History:  Diagnosis Date  . HIV (human immunodeficiency virus infection) (HCC)     Social History: Social History   Socioeconomic History  . Marital status: Married    Spouse name: Not on file  . Number of children: Not on file  . Years of education: Not on file  . Highest education level: Never attended school  Occupational History  . Not on file  Social Needs  . Financial resource strain: Not on file  . Food insecurity:    Worry: Often true    Inability: Often true  . Transportation needs:    Medical: Yes    Non-medical: Yes  Tobacco Use  . Smoking status: Never Smoker  . Smokeless tobacco: Never Used  Substance and Sexual Activity  . Alcohol use: Not Currently  . Drug use: Not Currently  . Sexual activity: Yes    Birth control/protection: None  Lifestyle  . Physical activity:    Days per week: Not on  file    Minutes per session: Not on file  . Stress: Not on file  Relationships  . Social connections:    Talks on phone: Not on file    Gets together: Not on file    Attends religious service: Not on file    Active member of club or organization: Not on file    Attends meetings of clubs or organizations: Not on file    Relationship status: Not on file  Other Topics Concern  . Not on file  Social History Narrative  . Not on file    Labs: Lab Results  Component Value Date   HIV1RNAQUANT <20 NOT DETECTED 11/15/2018   CD4TABS 800 11/15/2018    RPR and STI Lab Results  Component Value Date   LABRPR NON-REACTIVE 11/15/2018   LABRPR Nonreactive 10/17/2018    STI Results GC CT  11/15/2018 Negative Negative  10/17/2018 - Negative    Hepatitis B Lab Results  Component Value Date   HEPBSAB REACTIVE (A) 11/15/2018   HEPBSAG NON-REACTIVE 11/15/2018   HEPBCAB REACTIVE (A) 11/15/2018   Hepatitis C Lab Results  Component Value Date   HEPCAB NON-REACTIVE 11/15/2018   Hepatitis A Lab Results  Component Value Date   HAV REACTIVE (A) 11/15/2018   Lipids: Lab Results  Component Value Date   CHOL 185 11/15/2018   TRIG 88 11/15/2018  HDL 75 11/15/2018   CHOLHDL 2.5 11/15/2018   LDLCALC 92 11/15/2018    Current HIV Regimen: Odefsey  Assessment: Marcia Long is here today to initiate care with [facility] for her HIV infection.  She is undetectable on Odefsey but is [redacted] weeks pregnant and will need to be switched to Tivicay and Truvada.  Explained each medication to her and told her to never take one without the other.  Explained possible side effects and encouraged her not to miss any doses. I gave her my card and told her to call me with any issues.  Plan: - Stop Odefsey - Start Tivicay + Truvada PO once daily  Cassie L. Kuppelweiser, PharmD, BCIDP, AAHIVP, CPP Infectious Diseases Clinical Pharmacist [facility] 11/30/2018, 3:02 PM

## 2018-11-30 NOTE — Patient Instructions (Signed)
Stop odefsey when you get your new medications   Will need you to start taking Truvada and Tivicay once a day.

## 2018-12-01 ENCOUNTER — Ambulatory Visit (INDEPENDENT_AMBULATORY_CARE_PROVIDER_SITE_OTHER): Payer: Medicaid Other | Admitting: Obstetrics and Gynecology

## 2018-12-01 ENCOUNTER — Encounter: Payer: Self-pay | Admitting: Obstetrics and Gynecology

## 2018-12-01 VITALS — BP 108/62 | HR 61 | Wt 172.0 lb

## 2018-12-01 DIAGNOSIS — O09522 Supervision of elderly multigravida, second trimester: Secondary | ICD-10-CM

## 2018-12-01 DIAGNOSIS — O09892 Supervision of other high risk pregnancies, second trimester: Secondary | ICD-10-CM

## 2018-12-01 DIAGNOSIS — Z758 Other problems related to medical facilities and other health care: Secondary | ICD-10-CM

## 2018-12-01 DIAGNOSIS — Z789 Other specified health status: Secondary | ICD-10-CM

## 2018-12-01 DIAGNOSIS — O09523 Supervision of elderly multigravida, third trimester: Secondary | ICD-10-CM

## 2018-12-01 DIAGNOSIS — O09893 Supervision of other high risk pregnancies, third trimester: Secondary | ICD-10-CM

## 2018-12-01 DIAGNOSIS — O0992 Supervision of high risk pregnancy, unspecified, second trimester: Secondary | ICD-10-CM

## 2018-12-01 DIAGNOSIS — O0990 Supervision of high risk pregnancy, unspecified, unspecified trimester: Secondary | ICD-10-CM

## 2018-12-01 DIAGNOSIS — K0889 Other specified disorders of teeth and supporting structures: Secondary | ICD-10-CM

## 2018-12-01 MED ORDER — PRENATE MINI 18-0.6-0.4-350 MG PO CAPS: 1.0000 | ORAL_CAPSULE | Freq: Every day | ORAL | 12 refills | Status: DC

## 2018-12-01 MED ORDER — COMFORT FIT MATERNITY SUPP MED MISC: 0 refills | Status: DC

## 2018-12-01 NOTE — Progress Notes (Signed)
   PRENATAL VISIT NOTE  Subjective:  Marcia Long is a 38 y.o. G6P5005 at [redacted]w[redacted]d being seen today for ongoing prenatal care.  She is currently monitored for the following issues for this high-risk pregnancy and has Supervision of high risk pregnancy, antepartum; HIV (human immunodeficiency virus) risk factors complicating pregnancy; Language barrier; AMA (advanced maternal age) multigravida 35+; HIV (human immunodeficiency virus infection) (HCC); and Chronic dental pain on their problem list.  Patient reports tooth pain and back pain.  Contractions: Not present. Vag. Bleeding: None.  Movement: Present. Denies leaking of fluid.   The following portions of the patient's history were reviewed and updated as appropriate: allergies, current medications, past family history, past medical history, past social history, past surgical history and problem list. Problem list updated.  Objective:   Vitals:   12/01/18 1528  BP: 108/62  Pulse: 61  Weight: 172 lb (78 kg)    Fetal Status: Fetal Heart Rate (bpm): 147 Fundal Height: 27 cm Movement: Present     General:  Alert, oriented and cooperative. Patient is in no acute distress.  Skin: Skin is warm and dry. No rash noted.   Cardiovascular: Normal heart rate noted  Respiratory: Normal respiratory effort, no problems with respiration noted  Abdomen: Soft, gravid, appropriate for gestational age.  Pain/Pressure: Absent     Pelvic: Cervical exam deferred        Extremities: Normal range of motion.  Edema: None  Mental Status: Normal mood and affect. Normal behavior. Normal judgment and thought content.   Assessment and Plan:  Pregnancy: G6P5005 at [redacted]w[redacted]d  1. Supervision of high risk pregnancy, antepartum Patient is doing well Rx maternity support belt provided and patient was encouraged to stay active and to perform back exercises Patient referred to dentist for evaluation Follow up anatomy ultrasound scheduled Third trimester labs next  visit  2. HIV risk factors affecting pregnancy in third trimester Patient seen by infectious disease Follow up scheduled in 2 weeks  3. Multigravida of advanced maternal age in third trimester Low risks NIPS  4. Language barrier [place] interpreter present  5. Tooth pain - Ambulatory referral to Dentistry  Preterm labor symptoms and general obstetric precautions including but not limited to vaginal bleeding, contractions, leaking of fluid and fetal movement were reviewed in detail with the patient. Please refer to After Visit Summary for other counseling recommendations.  Return in about 2 weeks (around 12/15/2018) for ROB, 2 hr glucola next visit.  Future Appointments  Date Time Provider Department Center  12/15/2018  9:00 AM [facility] [place] 3 WH-M[facility]US [facility]  12/15/2018 10:00 AM WH-MFC MD RM [facility]-MFC [facility]  01/05/2019  2:30 PM Dixon, Gomez Cleverly, NP [facility]    Catalina Antigua, MD

## 2018-12-06 ENCOUNTER — Other Ambulatory Visit: Payer: Self-pay | Admitting: Behavioral Health

## 2018-12-06 ENCOUNTER — Telehealth: Payer: Self-pay | Admitting: Behavioral Health

## 2018-12-06 NOTE — Telephone Encounter (Signed)
[facility] is calling to attempt to set up delivery for patient's medications.  Patient is not answering calls and have not been able to set up delivery of new regimen. (Truvada, Tivicay).

## 2018-12-07 NOTE — Telephone Encounter (Signed)
I am not. I do know that she doesn't speak English and I'm hoping that is not the main reason why she cannot get her medications.

## 2018-12-07 NOTE — Telephone Encounter (Signed)
I will try to send a staff message to her OB team to see if they can help relay message.  Luckily she is well controlled on her Odefsey.

## 2018-12-15 ENCOUNTER — Other Ambulatory Visit: Payer: Medicaid Other

## 2018-12-15 ENCOUNTER — Encounter (HOSPITAL_COMMUNITY): Payer: Self-pay

## 2018-12-15 ENCOUNTER — Ambulatory Visit (HOSPITAL_COMMUNITY)
Admission: RE | Admit: 2018-12-15 | Discharge: 2018-12-15 | Disposition: A | Discharge status: Home / Self Care | Payer: Medicaid Other | Source: Ambulatory Visit | Attending: Obstetrics and Gynecology | Admitting: Obstetrics and Gynecology

## 2018-12-15 ENCOUNTER — Other Ambulatory Visit (HOSPITAL_COMMUNITY): Payer: Self-pay | Admitting: *Deleted

## 2018-12-15 ENCOUNTER — Ambulatory Visit (INDEPENDENT_AMBULATORY_CARE_PROVIDER_SITE_OTHER): Payer: Medicaid Other | Admitting: Obstetrics & Gynecology

## 2018-12-15 VITALS — BP 111/66 | HR 81 | Wt 172.0 lb

## 2018-12-15 DIAGNOSIS — O09523 Supervision of elderly multigravida, third trimester: Secondary | ICD-10-CM | POA: Diagnosis not present

## 2018-12-15 DIAGNOSIS — O98712 Human immunodeficiency virus [HIV] disease complicating pregnancy, second trimester: Secondary | ICD-10-CM | POA: Diagnosis not present

## 2018-12-15 DIAGNOSIS — O98713 Human immunodeficiency virus [HIV] disease complicating pregnancy, third trimester: Secondary | ICD-10-CM | POA: Diagnosis not present

## 2018-12-15 DIAGNOSIS — O09893 Supervision of other high risk pregnancies, third trimester: Secondary | ICD-10-CM

## 2018-12-15 DIAGNOSIS — Z3A29 29 weeks gestation of pregnancy: Secondary | ICD-10-CM

## 2018-12-15 DIAGNOSIS — O0990 Supervision of high risk pregnancy, unspecified, unspecified trimester: Secondary | ICD-10-CM

## 2018-12-15 DIAGNOSIS — O289 Unspecified abnormal findings on antenatal screening of mother: Secondary | ICD-10-CM

## 2018-12-15 DIAGNOSIS — Z362 Encounter for other antenatal screening follow-up: Secondary | ICD-10-CM | POA: Insufficient documentation

## 2018-12-15 DIAGNOSIS — O0933 Supervision of pregnancy with insufficient antenatal care, third trimester: Secondary | ICD-10-CM | POA: Diagnosis not present

## 2018-12-15 DIAGNOSIS — Z789 Other specified health status: Secondary | ICD-10-CM

## 2018-12-15 NOTE — Consult Note (Signed)
Maternal-Fetal Medicine Name: Adalae Kaai MRN: [id number] Requesting Provider: Catalina Antigua, MD  I had the [facility] of [facility]. We had a [place] language interpreter with [place] today. I obtained history and counseled the patient with help of interpreter.  Past medical history is significant for HIV infection that was diagnosed about 13 years ago after her first childbirth. She has been taking medications in [place] and her most-recent viral RNA (11/15/18) is undetectable. She informs she gets her medications mailed to her. From her records, I note that she takes Truvada once daily.  Patient does not have hepatitis C infection. 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 Quantiferon Gold screening for TB is negative.  PSH: Nil of note. Medications: Truvada, prenatal vitamins. Allergies: NKDA. Social: Denies tobacco or drug or alcohol use. Her husband, who is the father of all her children, is in good health. All her children live with her in the [place]. Family: No history of venous thromboembolism in the family. Obstetric history is significant for 5 term vaginal deliveries of female children. Patient reports she was aware of the diagnosis of HIV after her first childbirth. None of her children has HIV infection. All her children were born in [place].  Prenatal course: On cell-free fetal DNA screening, the risks of fetal aneuploidies are not increased. She does not have gestational diabetes.  I counseled the patient on the following: HIV infection in pregnancy (some comments are for the providers): -I emphasized the importance of continuing antiretroviral (ARV) medications throughout her pregnancy. ARV reduces the likelihood (but does not completely eliminate) of perinatal transmission.  -Perinatal transmission usually occurs at delivery. Antenatal transmission has been reported, but less-likely  with ARV.  -Her current medications are not associated with an increased risk for the fetus and the risk of congenital malformation is very low. -HIV infection is associated with a slight increase in preterm deliveries and fetal growth restriction. -Intrapartum management with zidovudine is indicated if viral load is greater than 1,000 copies/mL. If less than 1,000 copies/mL is seen, zidovudine need not be given as no increased perinatal transmission is seen. Patient should continue ART in labor or before cesarean delivery. If zidovudine is indicated, it should be administered for at least 3 hours before delivery and continued till clamping of umbilical cord.  -According to U.S. Department of Health and Human Services Guidelines, Intravenous zidovudine should be administered if viral RNA is greater than 1,000 copies/mL. If viral RNA levels are at 50 copies/mL or less, intrapartum zidovudine is not necessary. The guidelines also mention that intravenous zidovudine "may be considered for women with HIV RNA between 50 and 999 copies/mL."  -ACOG also endorses this view (ACOG Committee Opinion, No 757, September 2018). The likelihood of perinatal transmission without ZDV in this group (between 50 and 999 copies) is about 1% to 2% (as opposed to less than 1%).  -In summary, in women with viral RNA copies less than 1,000 copies/mL, intrapartum zidovudine is not necessary. It may be addressed again with the patient in the third trimester.  -Postoperative infections are slightly more common in patients with HIV infection. -Postpartum: Breastfeeding is contraindicated.  -Infant will receive ARV for 6 weeks. -PCP prophylaxis is indicated if the patient's CD4 counts drops below 200.  AVOID the following: . Artificial rupture of membranes (AROM) unless strongly indicated. . Fetal scalp electrode . Operative delivery including forceps or vacuum.  Thank you for  your consult. Please do not hesitate to contact  me if you have any questions or concerns. Consultation including face-to-face counseling: 40 min.

## 2018-12-15 NOTE — Progress Notes (Signed)
   PRENATAL VISIT NOTE  Subjective:  Marcia Long is a 38 y.o. G6P5005 at [redacted]w[redacted]d being seen today for ongoing prenatal care.  She is currently monitored for the following issues for this high-risk pregnancy and has Supervision of high risk pregnancy, antepartum; HIV (human immunodeficiency virus) risk factors complicating pregnancy; Language barrier; AMA (advanced maternal age) multigravida 35+; HIV (human immunodeficiency virus infection) (HCC); and Chronic dental pain on their problem list.  Patient reports no complaints.  Contractions: Not present. Vag. Bleeding: None.  Movement: Present. Denies leaking of fluid.   The following portions of the patient's history were reviewed and updated as appropriate: allergies, current medications, past family history, past medical history, past social history, past surgical history and problem list. Problem list updated.  Objective:   Vitals:   12/15/18 0841  BP: 111/66  Pulse: 81  Weight: 172 lb (78 kg)    Fetal Status: Fetal Heart Rate (bpm): 145   Movement: Present     General:  Alert, oriented and cooperative. Patient is in no acute distress.  Skin: Skin is warm and dry. No rash noted.   Cardiovascular: Normal heart rate noted  Respiratory: Normal respiratory effort, no problems with respiration noted  Abdomen: Soft, gravid, appropriate for gestational age.  Pain/Pressure: Absent     Pelvic: Cervical exam deferred        Extremities: Normal range of motion.  Edema: None  Mental Status: Normal mood and affect. Normal behavior. Normal judgment and thought content.   Assessment and Plan:  Pregnancy: G6P5005 at [redacted]w[redacted]d  1. Supervision of high risk pregnancy, antepartum - 2 hour GTT and labs today - follow up MFM u/s in the near future  2. HIV risk factors affecting pregnancy in third trimester - has seen ID and has a follow up appt in the near future. - she is not sure exactly what meds she is taking. - There is a note from [facility]  that they have been trying to contact her about her new HIV meds but that she is not answering. - so, she has not changed to the Truvada and Tyvica that ID ordered   3. Language barrier - Live interpretor present for exam - Speaks [place]  4. Multigravida of advanced maternal age in third trimester   Preterm labor symptoms and general obstetric precautions including but not limited to vaginal bleeding, contractions, leaking of fluid and fetal movement were reviewed in detail with the patient. Please refer to After Visit Summary for other counseling recommendations.  No follow-ups on file.  Future Appointments  Date Time Provider Department Center  12/15/2018  9:00 AM WH-MFC [place] 3 WH-MFCUS MFC-US  12/15/2018 10:00 AM WH-MFC MD RM WH-MFC MFC-US  01/05/2019  2:30 PM Blanchard Kelch, NP [facility]    Allie Bossier, MD

## 2018-12-16 LAB — CBC
Hematocrit: 34.4 % (ref 34.0–46.6)
Hemoglobin: 11.8 g/dL (ref 11.1–15.9)
MCH: 30 pg (ref 26.6–33.0)
MCHC: 34.3 g/dL (ref 31.5–35.7)
MCV: 88 fL (ref 79–97)
Platelets: 177 10*3/uL (ref 150–450)
RBC: 3.93 x10E6/uL (ref 3.77–5.28)
RDW: 13.4 % (ref 11.7–15.4)
WBC: 4.9 10*3/uL (ref 3.4–10.8)

## 2018-12-16 LAB — RPR: RPR Ser Ql: NONREACTIVE

## 2018-12-16 LAB — GLUCOSE TOLERANCE, 2 HOURS W/ 1HR
Glucose, 1 hour: 177 mg/dL (ref 65–179)
Glucose, 2 hour: 94 mg/dL (ref 65–152)
Glucose, Fasting: 77 mg/dL (ref 65–91)

## 2018-12-16 LAB — HIV 1/2 AB DIFFERENTIATION
HIV 1 Ab: POSITIVE — AB
HIV 2 Ab: NEGATIVE
NOTE (HIV CONF MULTIP: POSITIVE

## 2018-12-16 LAB — HIV ANTIBODY (ROUTINE TESTING W REFLEX): HIV Screen 4th Generation wRfx: REACTIVE — AB

## 2018-12-27 ENCOUNTER — Encounter: Payer: Self-pay | Admitting: Infectious Diseases

## 2019-01-05 ENCOUNTER — Ambulatory Visit: Payer: Medicaid Other | Admitting: Infectious Diseases

## 2019-01-06 ENCOUNTER — Ambulatory Visit (INDEPENDENT_AMBULATORY_CARE_PROVIDER_SITE_OTHER): Payer: Medicaid Other | Admitting: Family Medicine

## 2019-01-06 ENCOUNTER — Other Ambulatory Visit: Payer: Self-pay

## 2019-01-06 VITALS — BP 99/58 | HR 50 | Wt 171.3 lb

## 2019-01-06 DIAGNOSIS — O09523 Supervision of elderly multigravida, third trimester: Secondary | ICD-10-CM

## 2019-01-06 DIAGNOSIS — O09893 Supervision of other high risk pregnancies, third trimester: Secondary | ICD-10-CM

## 2019-01-06 DIAGNOSIS — O0993 Supervision of high risk pregnancy, unspecified, third trimester: Secondary | ICD-10-CM | POA: Diagnosis not present

## 2019-01-06 DIAGNOSIS — Z789 Other specified health status: Secondary | ICD-10-CM | POA: Diagnosis not present

## 2019-01-06 DIAGNOSIS — Z3A32 32 weeks gestation of pregnancy: Secondary | ICD-10-CM

## 2019-01-06 DIAGNOSIS — O0990 Supervision of high risk pregnancy, unspecified, unspecified trimester: Secondary | ICD-10-CM

## 2019-01-06 DIAGNOSIS — Z23 Encounter for immunization: Secondary | ICD-10-CM

## 2019-01-06 NOTE — Patient Instructions (Signed)

## 2019-01-06 NOTE — Progress Notes (Signed)
Called [facility] to inquire about pt's HIV medications not arriving at the pt's home.  Per pharmacy, they were unable to reach her via telephone and that resulted in this delay.  Address verified with pt using in person interpreter and all pharmacist's questions answered.  The medication will be overnighted and should arrive tomorrow 01/07/19 at the pt's home.

## 2019-01-07 NOTE — Progress Notes (Signed)
    PRENATAL VISIT NOTE  Subjective:  Marcia Long is a 38 y.o. G6P5005 at [redacted]w[redacted]d being seen today for ongoing prenatal care.  She is currently monitored for the following issues for this high-risk pregnancy and has Supervision of high risk pregnancy, antepartum; HIV (human immunodeficiency virus) risk factors complicating pregnancy; Language barrier; AMA (advanced maternal age) multigravida 35+; HIV (human immunodeficiency virus infection) (HCC); and Chronic dental pain on their problem list.  Patient reports no complaints.  Contractions: Not present. Vag. Bleeding: None.  Movement: Present. Denies leaking of fluid.   The following portions of the patient's history were reviewed and updated as appropriate: allergies, current medications, past family history, past medical history, past social history, past surgical history and problem list.   Objective:   Vitals:   01/06/19 1103  BP: (!) 99/58  Pulse: (!) 50  Weight: 171 lb 4.8 oz (77.7 kg)    Fetal Status: Fetal Heart Rate (bpm): 138 Fundal Height: 33 cm Movement: Present     General:  Alert, oriented and cooperative. Patient is in no acute distress.  Skin: Skin is warm and dry. No rash noted.   Cardiovascular: Normal heart rate noted  Respiratory: Normal respiratory effort, no problems with respiration noted  Abdomen: Soft, gravid, appropriate for gestational age.  Pain/Pressure: Absent     Pelvic: Cervical exam deferred        Extremities: Normal range of motion.  Edema: None  Mental Status: Normal mood and affect. Normal behavior. Normal judgment and thought content.   Assessment and Plan:  Pregnancy: G6P5005 at [redacted]w[redacted]d 1. Supervision of high risk pregnancy, antepartum BTL papers signed, though she will bring husband in for conversation around this before fully agreeing. - Tdap vaccine greater than or equal to 7yo IM  2. HIV risk factors affecting pregnancy in third trimester Worked on getting meds for pat. Should arrive by  tomorrow.  3. Multigravida of advanced maternal age in third trimester   4. Language barrier In person interpreter used  Preterm labor symptoms and general obstetric precautions including but not limited to vaginal bleeding, contractions, leaking of fluid and fetal movement were reviewed in detail with the patient. Please refer to After Visit Summary for other counseling recommendations.   Return in 2 weeks (on 01/20/2019).  Future Appointments  Date Time Provider Department Center  01/13/2019 11:30 AM [facility]-MFC NURSE [facility]  01/13/2019 11:30 AM WH-MFC [place] 5 WH-MFCUS MFC-US  01/18/2019  2:35 PM Reva Bores, MD [facility]    Reva Bores, MD

## 2019-01-10 ENCOUNTER — Encounter: Payer: Self-pay | Admitting: *Deleted

## 2019-01-13 ENCOUNTER — Ambulatory Visit (HOSPITAL_COMMUNITY)
Admission: RE | Admit: 2019-01-13 | Discharge: 2019-01-13 | Disposition: A | Discharge status: Home / Self Care | Payer: Medicaid Other | Source: Ambulatory Visit | Attending: Obstetrics and Gynecology | Admitting: Obstetrics and Gynecology

## 2019-01-13 ENCOUNTER — Other Ambulatory Visit: Payer: Self-pay

## 2019-01-13 ENCOUNTER — Encounter (HOSPITAL_COMMUNITY): Payer: Self-pay

## 2019-01-13 ENCOUNTER — Other Ambulatory Visit (HOSPITAL_COMMUNITY): Payer: Self-pay | Admitting: *Deleted

## 2019-01-13 ENCOUNTER — Ambulatory Visit (HOSPITAL_COMMUNITY): Payer: Medicaid Other | Admitting: *Deleted

## 2019-01-13 VITALS — BP 105/70 | HR 86 | Temp 98.7°F | Wt 171.8 lb

## 2019-01-13 DIAGNOSIS — Z3A33 33 weeks gestation of pregnancy: Secondary | ICD-10-CM | POA: Diagnosis not present

## 2019-01-13 DIAGNOSIS — O289 Unspecified abnormal findings on antenatal screening of mother: Secondary | ICD-10-CM | POA: Diagnosis not present

## 2019-01-13 DIAGNOSIS — O09523 Supervision of elderly multigravida, third trimester: Secondary | ICD-10-CM

## 2019-01-13 DIAGNOSIS — Z362 Encounter for other antenatal screening follow-up: Secondary | ICD-10-CM

## 2019-01-13 DIAGNOSIS — O0933 Supervision of pregnancy with insufficient antenatal care, third trimester: Secondary | ICD-10-CM | POA: Diagnosis not present

## 2019-01-13 DIAGNOSIS — O98719 Human immunodeficiency virus [HIV] disease complicating pregnancy, unspecified trimester: Secondary | ICD-10-CM

## 2019-01-13 DIAGNOSIS — O98713 Human immunodeficiency virus [HIV] disease complicating pregnancy, third trimester: Secondary | ICD-10-CM | POA: Diagnosis not present

## 2019-01-18 ENCOUNTER — Other Ambulatory Visit: Payer: Self-pay

## 2019-01-18 ENCOUNTER — Ambulatory Visit (INDEPENDENT_AMBULATORY_CARE_PROVIDER_SITE_OTHER): Payer: Medicaid Other | Admitting: Family Medicine

## 2019-01-18 VITALS — BP 104/68 | HR 80 | Temp 97.9°F | Wt 172.0 lb

## 2019-01-18 DIAGNOSIS — O09893 Supervision of other high risk pregnancies, third trimester: Secondary | ICD-10-CM

## 2019-01-18 DIAGNOSIS — O09523 Supervision of elderly multigravida, third trimester: Secondary | ICD-10-CM

## 2019-01-18 DIAGNOSIS — Z3A34 34 weeks gestation of pregnancy: Secondary | ICD-10-CM

## 2019-01-18 DIAGNOSIS — O0990 Supervision of high risk pregnancy, unspecified, unspecified trimester: Secondary | ICD-10-CM

## 2019-01-19 NOTE — Progress Notes (Signed)
   PRENATAL VISIT NOTE Telephone interpreter used Subjective:  Marcia Long is a 38 y.o. G6P5005 at [redacted]w[redacted]d being seen today for ongoing prenatal care.  She is currently monitored for the following issues for this high-risk pregnancy and has Supervision of high risk pregnancy, antepartum; HIV (human immunodeficiency virus) risk factors complicating pregnancy; Language barrier; AMA (advanced maternal age) multigravida 35+; HIV (human immunodeficiency virus infection) (HCC); and Chronic dental pain on their problem list.  Patient reports no complaints.  Contractions: Not present. Vag. Bleeding: None.  Movement: Present. Denies leaking of fluid.   The following portions of the patient's history were reviewed and updated as appropriate: allergies, current medications, past family history, past medical history, past social history, past surgical history and problem list.   Objective:   Vitals:   01/18/19 1441  BP: 104/68  Pulse: 80  Temp: 97.9 F (36.6 C)  Weight: 172 lb (78 kg)    Fetal Status: Fetal Heart Rate (bpm): 135 Fundal Height: 32 cm Movement: Present     General:  Alert, oriented and cooperative. Patient is in no acute distress.  Skin: Skin is warm and dry. No rash noted.   Cardiovascular: Normal heart rate noted  Respiratory: Normal respiratory effort, no problems with respiration noted  Abdomen: Soft, gravid, appropriate for gestational age.  Pain/Pressure: Absent     Pelvic: Cervical exam deferred        Extremities: Normal range of motion.  Edema: None  Mental Status: Normal mood and affect. Normal behavior. Normal judgment and thought content.   Assessment and Plan:  Pregnancy: G6P5005 at [redacted]w[redacted]d 1. Supervision of high risk pregnancy, antepartum Continue prenatal care.   2. HIV risk factors affecting pregnancy in third trimester Continue Tivicay and Truvada ID f/u scheduled--appointment time given  3. Multigravida of advanced maternal age in third trimester Normal  NIPT  Preterm labor symptoms and general obstetric precautions including but not limited to vaginal bleeding, contractions, leaking of fluid and fetal movement were reviewed in detail with the patient. Please refer to After Visit Summary for other counseling recommendations.   Return in 2 weeks (on 02/01/2019).  Future Appointments  Date Time Provider Department Center  01/24/2019  4:00 PM Blanchard Kelch, NP [facility]  02/02/2019  3:55 PM Willodean Rosenthal, MD [facility] W[facility]  02/17/2019 11:30 AM WH-MFC NURSE WH-MFC MFC-US  02/17/2019 11:30 AM WH-MFC [place] 5 WH-MFCUS MFC-US    Reva Bores, MD

## 2019-01-20 ENCOUNTER — Telehealth: Payer: Self-pay | Admitting: *Deleted

## 2019-01-20 NOTE — Telephone Encounter (Addendum)
Attempted to call using [employer]. There were no [place] interpreters available at the time, none for the rest of the business day.  Given that she is coming 3/31, would you want to switch this appointment to lab only, or take advantage of the in-person translator scheduled to come with her? Andree Coss, RN    Blanchard Kelch, NP  Andree Coss, RN        It would be nice to do a lab visit and then televisit but that sounds like a disaster as her dialect is challenging to arrange. If we can work with OB to help understand NO breastfeeding I would be OK with lab visit only to monitor viral loads to keep interactions with medical providers limited for her.   What do you think?   Previous Messages    ----- Message -----  From: Andree Coss, RN  Sent: 01/19/2019 11:49 AM EDT  To: Blanchard Kelch, NP   OB/GYN reviewed her meds, appointment with you. From office notes:  2. HIV risk factors affecting pregnancy in third trimester  Continue Tivicay and Truvada  ID f/u scheduled--appointment time given   Seeing you 3/31 4:00   I'll use the language line (hopefully) to confirm. You'd still prefer to keep this appointment, right?  Marcia Long    ----- Message -----  From: Blanchard Kelch, NP  Sent: 01/14/2019 10:45 AM EDT  To: Andree Coss, RN   Pregnant B20 - 33w 3d. She no-showed her follow up with me. Fortunately she was undetectable on Odefsey but would prefer her on Tivicay/Truvada. She is going to be a challenge as she has a very specific dialect that (per [facility] ID notes) has been very difficult.  Any suggestions to help keep her in care?

## 2019-01-20 NOTE — Telephone Encounter (Signed)
Let's do the in person visit. I would like to again review recommendations around no breastfeeding and such.  Her situation is difficult with regards to language barrier.

## 2019-01-24 ENCOUNTER — Ambulatory Visit (INDEPENDENT_AMBULATORY_CARE_PROVIDER_SITE_OTHER): Payer: Medicaid Other | Admitting: Infectious Diseases

## 2019-01-24 ENCOUNTER — Other Ambulatory Visit: Payer: Self-pay

## 2019-01-24 DIAGNOSIS — O09893 Supervision of other high risk pregnancies, third trimester: Secondary | ICD-10-CM

## 2019-01-24 DIAGNOSIS — B20 Human immunodeficiency virus [HIV] disease: Secondary | ICD-10-CM

## 2019-01-24 NOTE — Patient Instructions (Signed)
Please come back in 1 month. We will repeat blood work then.   Please continue taking your yellow pill and blue pill every day together.

## 2019-01-25 ENCOUNTER — Encounter: Payer: Self-pay | Admitting: Infectious Diseases

## 2019-01-25 NOTE — Progress Notes (Signed)
Name: Marcia Long  DOB: 1981-10-07 MRN: [id number] PCP: Patient, No Pcp Per    Patient Active Problem List   Diagnosis Date Noted  . HIV (human immunodeficiency virus infection) (HCC) 11/30/2018  . Chronic dental pain 11/30/2018  . Supervision of high risk pregnancy, antepartum 11/03/2018  . HIV (human immunodeficiency virus) risk factors complicating pregnancy 11/03/2018  . Language barrier 11/03/2018  . AMA (advanced maternal age) multigravida 35+ 11/03/2018     Brief Narrative:  Marcia Long is a 38 y.o. female with HIV disease, dx 2006. Immigrated from [place] of [place] 07-2015 as refugee. Native language Sango. In care at [facility] ID clinic previously. Married, husband HIV+. A refugee. History of OIs: unknown.  Previous Regimens: . Odefsey 2017 >> undetectable  Genotypes: . 2017 chart records indicate no major RT, PI, INSTI mutations  Subjective:  CC:  Establish / transfer HIV care. 34w 7d (EDD 03/01/2019). Her husband joins her today. No interpretor was available for the visit due to 2 hour early arrival. She feels comfortable speaking English and proceeding with the visit.    HPI:  Marcia Long tells me she is doing very well and excited about her son's arrival in the next month.  She has picked up her Tivicay and Truvada is taking both "the little yellow and big blue pill once a day."  She has not missed any doses of her medication and has not noticed much of a change since switching off of her Odefsey.  She tells me she is not having any contractions yet and reports adequate fetal movement.  She was given information about breast-feeding from another clinic and is a little confused.  She is wondering if she can breast-feed but she remembers our previous conversation that I told her she should not.  Review of Systems  Constitutional: Negative for chills, fever, malaise/fatigue and weight loss.  HENT: Negative for sore throat.        No dental problems   Respiratory: Negative for cough and sputum production.   Cardiovascular: Negative for chest pain and leg swelling.  Gastrointestinal: Negative for abdominal pain, diarrhea and vomiting.  Genitourinary: Negative for dysuria and flank pain.  Musculoskeletal: Negative for joint pain, myalgias and neck pain.  Skin: Negative for rash.  Neurological: Negative for dizziness, tingling and headaches.  Psychiatric/Behavioral: Negative for depression and substance abuse. The patient is not nervous/anxious and does not have insomnia.     Past Medical History:  Diagnosis Date  . HIV (human immunodeficiency virus infection) (HCC)     Outpatient Medications Prior to Visit  Medication Sig Dispense Refill  . dolutegravir (TIVICAY) 50 MG tablet Take 1 tablet (50 mg total) by mouth daily. 30 tablet 11  . Elastic Bandages & Supports (COMFORT FIT MATERNITY SUPP MED) MISC Wear daily when ambulating (Patient not taking: Reported on 12/15/2018) 1 each 0  . emtricitabine-tenofovir (TRUVADA) 200-300 MG tablet Take 1 tablet by mouth daily. 30 tablet 11  . Prenat-FeCbn-FeAsp-Meth-FA-DHA (PRENATE MINI) 18-0.6-0.4-350 MG CAPS Take 1 tablet by mouth daily. 30 capsule 12  . prenatal vitamin w/FE, FA (PRENATAL 1 + 1) 27-1 MG TABS tablet Take 1 tablet by mouth daily at 12 noon.     No facility-administered medications prior to visit.      No Known Allergies  Social History   Tobacco Use  . Smoking status: Never Smoker  . Smokeless tobacco: Never Used  Substance Use Topics  . Alcohol use: Not Currently  . Drug use: Not Currently  No family history on file.  Social History   Substance and Sexual Activity  Sexual Activity Yes  . Birth control/protection: None     Objective:  There were no vitals filed for this visit. There is no height or weight on file to calculate BMI.   Lab Results Lab Results  Component Value Date   WBC 4.9 12/15/2018   HGB 11.8 12/15/2018   HCT 34.4 12/15/2018   MCV 88  12/15/2018   PLT 177 12/15/2018    Lab Results  Component Value Date   CREATININE 0.63 11/15/2018   BUN 8 11/15/2018   NA 138 11/15/2018   K 4.0 11/15/2018   CL 105 11/15/2018   CO2 26 11/15/2018    Lab Results  Component Value Date   ALT 15 11/15/2018   AST 12 11/15/2018   BILITOT 0.3 11/15/2018    Lab Results  Component Value Date   CHOL 185 11/15/2018   HDL 75 11/15/2018   LDLCALC 92 11/15/2018   TRIG 88 11/15/2018   CHOLHDL 2.5 11/15/2018   HIV 1 RNA Quant (copies/mL)  Date Value  11/15/2018 <20 NOT DETECTED   CD4 T Cell Abs (/uL)  Date Value  11/15/2018 800     Assessment & Plan:   Problem List Items Addressed This Visit      Unprioritized   HIV (human immunodeficiency virus) risk factors complicating pregnancy    Lama seems to be tolerating the switch of her medications well and I am glad she picked them up from the pharmacy.  She has been receiving these through the mail and no missed doses.  She was previously very well controlled on her Odefsey and I suspect she will remain undetectable with today's lab draw.  We will repeat her viral load today at nearly [redacted] weeks gestation.  If she is undetectable the DHHS guidelines around perinatal care for HIV-positive women support natural vaginal delivery with monotherapy for neonatal prophylaxis postpartum.  Discussed again recommendation to formula feed. If she does desire to breastfeed still will discuss harm reduction practices further and infant antiretroviral prophylaxis throughout breastfeeding. Presumed risk for acquisition is statistically very low 0.3 - 0.6% transition over 12 months of nursing in the setting of maternal viral suppression and infant ARV proph.       Other Visit Diagnoses    Human immunodeficiency virus (HIV) disease (HCC)    -  Primary   Relevant Orders   HIV-1 RNA quant-no reflex-bld     I spent 15 minutes with the patient discussion directly of the above, intrapartum and postpartum  expectations for HIV-positive mother   Rexene Alberts, MSN, NP-C [facility] Pager: [phone] Office: [phone]  01/25/19  10:31 PM

## 2019-01-25 NOTE — Assessment & Plan Note (Signed)
Marcia Long seems to be tolerating the switch of her medications well and I am glad she picked them up from the pharmacy.  She has been receiving these through the mail and no missed doses.  She was previously very well controlled on her Odefsey and I suspect she will remain undetectable with today's lab draw.  We will repeat her viral load today at nearly [redacted] weeks gestation.  If she is undetectable the DHHS guidelines around perinatal care for HIV-positive women support natural vaginal delivery with monotherapy for neonatal prophylaxis postpartum.  Discussed again recommendation to formula feed. If she does desire to breastfeed still will discuss harm reduction practices further and infant antiretroviral prophylaxis throughout breastfeeding. Presumed risk for acquisition is statistically very low 0.3 - 0.6% transition over 12 months of nursing in the setting of maternal viral suppression and infant ARV proph.

## 2019-01-31 ENCOUNTER — Telehealth: Payer: Self-pay | Admitting: Student

## 2019-01-31 ENCOUNTER — Encounter: Payer: Medicaid Other | Admitting: Student

## 2019-01-31 ENCOUNTER — Other Ambulatory Visit: Payer: Self-pay

## 2019-01-31 NOTE — Telephone Encounter (Signed)
Based on [employer] conversation with interpreter, patient was to have a telehealth visit, not an in-person visit. Attempted to call interpreter twice ([place], # [phone]) for assistance in locating this patient, as patient did not come to appt or call. Will send message to [facility] to reschedule patient for next week for inperson visit as she needs cultures.

## 2019-02-02 ENCOUNTER — Encounter: Payer: Medicaid Other | Admitting: Obstetrics & Gynecology

## 2019-02-03 LAB — HIV-1 RNA QUANT-NO REFLEX-BLD
HIV 1 RNA Quant: <20 copies/mL
HIV-1 RNA Quant, Log: <1.3 Log copies/mL

## 2019-02-06 ENCOUNTER — Telehealth: Payer: Self-pay | Admitting: *Deleted

## 2019-02-06 NOTE — Telephone Encounter (Signed)
Pt left message which was very difficult to understand. She was stating that she went to the hospital but did not get there. She would like a call back.

## 2019-02-10 NOTE — Telephone Encounter (Signed)
Called pt with [facility] [phone] , had the iinterperter LVM that we were calling from the [facility] for [facility] health and to give the office a call back if she needed anything.

## 2019-02-17 ENCOUNTER — Ambulatory Visit (HOSPITAL_COMMUNITY): Payer: Medicaid Other

## 2019-02-21 ENCOUNTER — Other Ambulatory Visit: Payer: Self-pay

## 2019-02-21 ENCOUNTER — Encounter: Payer: Self-pay | Admitting: Infectious Diseases

## 2019-02-21 ENCOUNTER — Ambulatory Visit (INDEPENDENT_AMBULATORY_CARE_PROVIDER_SITE_OTHER): Payer: Medicaid Other | Admitting: Infectious Diseases

## 2019-02-21 VITALS — BP 110/73 | HR 84 | Temp 98.4°F | Wt 176.0 lb

## 2019-02-21 DIAGNOSIS — O0990 Supervision of high risk pregnancy, unspecified, unspecified trimester: Secondary | ICD-10-CM

## 2019-02-21 DIAGNOSIS — Z21 Asymptomatic human immunodeficiency virus [HIV] infection status: Secondary | ICD-10-CM | POA: Diagnosis not present

## 2019-02-21 NOTE — Patient Instructions (Signed)
Nice to see you today.  Continue to take your Tivicay and Truvada once daily as you are.  This is safe to continue after your delivery of your son.    We will have you come back in 3 months and we can switch you back to a 1 to day tablet.  I wish you lots of luck!

## 2019-02-21 NOTE — Progress Notes (Signed)
Name: Marcia Long  DOB: [birth year] MRN: [id number] PCP: Patient, No Pcp Per    Patient Active Problem List   Diagnosis Date Noted  . Chorioamnionitis 02/24/2019  . Maternal fever during labor 02/24/2019  . Arrest of descent, delivered, current hospitalization 02/24/2019  . Encounter for induction of labor 02/24/2019  . Failed induction of labor 02/24/2019  . [name] multiparity 02/24/2019  . Oligohydramnios in third trimester 02/23/2019  . HIV (human immunodeficiency virus infection) ([place]) 11/30/2018  . Chronic dental pain 11/30/2018  . Supervision of high risk pregnancy, antepartum 11/03/2018  . HIV (human immunodeficiency virus) risk factors complicating pregnancy 35/32/9924  . Language barrier 11/03/2018  . AMA (advanced maternal age) multigravida 35+ 11/03/2018     Brief Narrative:  Marcia Long is a 38 y.o. female with HIV disease, dx 2006. Immigrated from [place] [phone] as refugee. Native language [place]. In care at [place] [facility] previously. Married, husband HIV+. A refugee. History of OIs: unknown.  Previous Regimens: . [place] 2017 >> undetectable  Genotypes: . 2017 chart records indicate no major RT, PI, INSTI mutations  Subjective:  CC:  Routine follow-up care for HIV disease.  Currently pregnant with her sixth child in third trimester.  38 weeks 4 days at the time of visit.  Telephone interpreter to facilitate the visit.  HPI:  Marcia Long is doing well.  She last followed with our office about a month ago.  She has not had follow-up with maternal-fetal medicine but has an upcoming appointment that she is able to communicate with me.  [place]  She likes her medications very much and has continued the Tivicay and Truvada once daily.  She is had no trouble keeping this medication down.  She has had no side effects to these medicines and likes them better than the [facility] although she misses a  single tablet regimen.She remembers our conversations in the past surrounding bottlefeeding her unborn child and is still planning to do this.  Although she still has questions as to if she is on her medications why can her baby have natural milk.  She is hoping for vaginal delivery.  She has had several contractions but nothing significant or substantial that continue.  Review of Systems  Constitutional: Negative for chills, fever, malaise/fatigue and weight loss.  HENT: Negative for sore throat.        No dental problems  Respiratory: Negative for cough and sputum production.   Cardiovascular: Negative for chest pain and leg swelling.  Gastrointestinal: Negative for abdominal pain, diarrhea and vomiting.  Genitourinary: Negative for dysuria and flank pain.  Musculoskeletal: Negative for joint pain, myalgias and neck pain.  Skin: Negative for rash.  Neurological: Negative for dizziness, tingling and headaches.  Psychiatric/Behavioral: Negative for depression and substance abuse. The patient is not nervous/anxious and does not have insomnia.     Past Medical History:  Diagnosis Date  . HIV (human immunodeficiency virus infection) ([place])   . HIV (human immunodeficiency virus infection) ([place]) 11/30/2018   Dx 2006 HLA neg Quantiferon neg Hep B sAg neg      No facility-administered medications prior to visit.    Outpatient Medications Prior to Visit  Medication Sig Dispense Refill  . dolutegravir (TIVICAY) 50 MG tablet Take 1 tablet (50 mg total) by mouth daily. 30 tablet 11  . Elastic Bandages & Supports (COMFORT FIT MATERNITY SUPP MED) MISC Wear daily when ambulating 1 each 0  .  emtricitabine-tenofovir (TRUVADA) 200-300 MG tablet Take 1 tablet by mouth daily. 30 tablet 11  . Prenat-FeCbn-FeAsp-Meth-FA-DHA (PRENATE MINI) 18-0.6-0.4-350 MG CAPS Take 1 tablet by mouth daily. 30 capsule 12  . prenatal vitamin w/FE, FA (PRENATAL 1 + 1) 27-1 MG TABS tablet Take 1 tablet by mouth daily at 12 noon.        No Known Allergies  Social History   Tobacco Use  . Smoking status: Never Smoker  . Smokeless tobacco: Never Used  Substance Use Topics  . Alcohol use: Not Currently  . Drug use: Not Currently    Social History   Substance and Sexual Activity  Sexual Activity Yes  . Birth control/protection: None     Objective:   Vitals:   02/21/19 1518  BP: 110/73  Pulse: 84  Temp: 98.4 F (36.9 C)  Weight: 176 lb (79.8 kg)   Body mass index is 28.84 kg/m.   Lab Results Lab Results  Component Value Date   WBC 18.1 (H) 02/25/2019   HGB 9.8 (L) 02/25/2019   HCT 29.5 (L) 02/25/2019   MCV 87.0 02/25/2019   PLT 151 02/25/2019    Lab Results  Component Value Date   CREATININE 0.95 02/25/2019   BUN 8 11/15/2018   NA 138 11/15/2018   K 4.0 11/15/2018   CL 105 11/15/2018   CO2 26 11/15/2018    Lab Results  Component Value Date   ALT 15 11/15/2018   AST 12 11/15/2018   BILITOT 0.3 11/15/2018    Lab Results  Component Value Date   CHOL 185 11/15/2018   HDL 75 11/15/2018   LDLCALC 92 11/15/2018   TRIG 88 11/15/2018   CHOLHDL 2.5 11/15/2018   HIV 1 RNA Quant (copies/mL)  Date Value  02/23/2019 60  02/21/2019 <20 NOT DETECTED  01/24/2019 <20 NOT DETECTED   CD4 T Cell Abs (/uL)  Date Value  11/15/2018 800     Assessment & Plan:   Problem List Items Addressed This Visit      Unprioritized   HIV (human immunodeficiency virus infection) ([place]) - Primary    Doing well on Tivicay Truvada.  We will plan to switch either back to [facility] or once daily Biktarvy after delivery.  She can follow-up here in 3 months postpartum.  Asked her to continue taking her Tivicay and Truvada.  If she finds that it would be easier to resume single tablet regimen before our office visit together we can certainly change earlier.      Relevant Orders   HIV-1 RNA quant-no reflex-bld (Completed)   Supervision of high risk pregnancy, antepartum    Chavy is doing very well on her  medication regimen.  She is taking her medications as asked.  She has had serial undetectable viral loads.  We will check 1 more today prior to her upcoming delivery.  Discussed upcoming appointment with MFM clinic.Discussed again recommendation to formula feed. If she does desire to breastfeed still will discuss harm reduction practices further and infant antiretroviral prophylaxis throughout breastfeeding. Presumed risk for acquisition is statistically very low 0.3 - 0.6% transition over 12 months of nursing in the setting of maternal viral suppression and infant ARV proph.        I spent 15 minutes with the patient discussion directly of the above, intrapartum and postpartum expectations for HIV-positive mother   Janene Madeira, MSN, NP-C [facility] [place] Pager: [phone] Office: [phone]  02/27/19  8:33 AM

## 2019-02-22 ENCOUNTER — Other Ambulatory Visit (HOSPITAL_COMMUNITY): Payer: Self-pay | Admitting: Maternal & Fetal Medicine

## 2019-02-22 ENCOUNTER — Ambulatory Visit (HOSPITAL_BASED_OUTPATIENT_CLINIC_OR_DEPARTMENT_OTHER)
Admission: RE | Admit: 2019-02-22 | Discharge: 2019-02-22 | Disposition: A | Discharge status: Home / Self Care | Payer: Medicaid Other | Source: Ambulatory Visit | Attending: Maternal & Fetal Medicine | Admitting: Maternal & Fetal Medicine

## 2019-02-22 ENCOUNTER — Ambulatory Visit (HOSPITAL_COMMUNITY): Payer: Medicaid Other | Admitting: *Deleted

## 2019-02-22 ENCOUNTER — Encounter (HOSPITAL_COMMUNITY): Payer: Self-pay

## 2019-02-22 ENCOUNTER — Telehealth (HOSPITAL_COMMUNITY): Payer: Self-pay

## 2019-02-22 VITALS — Temp 98.4°F

## 2019-02-22 DIAGNOSIS — O09523 Supervision of elderly multigravida, third trimester: Secondary | ICD-10-CM

## 2019-02-22 DIAGNOSIS — O98719 Human immunodeficiency virus [HIV] disease complicating pregnancy, unspecified trimester: Secondary | ICD-10-CM

## 2019-02-22 DIAGNOSIS — Z362 Encounter for other antenatal screening follow-up: Secondary | ICD-10-CM

## 2019-02-22 DIAGNOSIS — O98713 Human immunodeficiency virus [HIV] disease complicating pregnancy, third trimester: Secondary | ICD-10-CM

## 2019-02-22 DIAGNOSIS — O289 Unspecified abnormal findings on antenatal screening of mother: Secondary | ICD-10-CM

## 2019-02-22 DIAGNOSIS — O0933 Supervision of pregnancy with insufficient antenatal care, third trimester: Secondary | ICD-10-CM

## 2019-02-22 DIAGNOSIS — Z3A39 39 weeks gestation of pregnancy: Secondary | ICD-10-CM

## 2019-02-23 ENCOUNTER — Encounter (HOSPITAL_COMMUNITY): Payer: Self-pay | Admitting: *Deleted

## 2019-02-23 ENCOUNTER — Inpatient Hospital Stay (HOSPITAL_COMMUNITY)
Admission: AD | Admit: 2019-02-23 | Discharge: 2019-02-28 | DRG: 783 | Disposition: A | Discharge status: Home / Self Care | Payer: Medicaid Other | Attending: Obstetrics and Gynecology | Admitting: Obstetrics and Gynecology

## 2019-02-23 DIAGNOSIS — O4103X0 Oligohydramnios, third trimester, not applicable or unspecified: Secondary | ICD-10-CM | POA: Diagnosis not present

## 2019-02-23 DIAGNOSIS — Z3A39 39 weeks gestation of pregnancy: Secondary | ICD-10-CM

## 2019-02-23 DIAGNOSIS — O411230 Chorioamnionitis, third trimester, not applicable or unspecified: Secondary | ICD-10-CM | POA: Diagnosis present

## 2019-02-23 DIAGNOSIS — Z3490 Encounter for supervision of normal pregnancy, unspecified, unspecified trimester: Secondary | ICD-10-CM | POA: Diagnosis present

## 2019-02-23 DIAGNOSIS — O09529 Supervision of elderly multigravida, unspecified trimester: Secondary | ICD-10-CM

## 2019-02-23 DIAGNOSIS — Z302 Encounter for sterilization: Secondary | ICD-10-CM

## 2019-02-23 DIAGNOSIS — Z21 Asymptomatic human immunodeficiency virus [HIV] infection status: Secondary | ICD-10-CM | POA: Diagnosis not present

## 2019-02-23 DIAGNOSIS — O9872 Human immunodeficiency virus [HIV] disease complicating childbirth: Secondary | ICD-10-CM | POA: Diagnosis present

## 2019-02-23 DIAGNOSIS — Z641 Problems related to multiparity: Secondary | ICD-10-CM

## 2019-02-23 DIAGNOSIS — Z603 Acculturation difficulty: Secondary | ICD-10-CM | POA: Diagnosis present

## 2019-02-23 DIAGNOSIS — Z789 Other specified health status: Secondary | ICD-10-CM | POA: Diagnosis present

## 2019-02-23 DIAGNOSIS — O411290 Chorioamnionitis, unspecified trimester, not applicable or unspecified: Secondary | ICD-10-CM | POA: Diagnosis not present

## 2019-02-23 DIAGNOSIS — O09899 Supervision of other high risk pregnancies, unspecified trimester: Secondary | ICD-10-CM

## 2019-02-23 LAB — CBC
HCT: 34.4 % — ABNORMAL LOW (ref 36.0–46.0)
Hemoglobin: 11.5 g/dL — ABNORMAL LOW (ref 12.0–15.0)
MCH: 29.1 pg (ref 26.0–34.0)
MCHC: 33.4 g/dL (ref 30.0–36.0)
MCV: 87.1 fL (ref 80.0–100.0)
Platelets: 143 10*3/uL — ABNORMAL LOW (ref 150–400)
RBC: 3.95 MIL/uL (ref 3.87–5.11)
RDW: 13.8 % (ref 11.5–15.5)
WBC: 4.5 10*3/uL (ref 4.0–10.5)
nRBC: 0 % (ref 0.0–0.2)

## 2019-02-23 LAB — TYPE AND SCREEN
ABO/RH(D): A POS
Antibody Screen: NEGATIVE

## 2019-02-23 LAB — OB RESULTS CONSOLE GBS: GBS: NEGATIVE

## 2019-02-23 LAB — GROUP B STREP BY PCR: Group B strep by PCR: NEGATIVE

## 2019-02-23 MED ORDER — OXYTOCIN BOLUS FROM INFUSION: 500.0000 mL | Freq: Once | INTRAVENOUS | Status: DC

## 2019-02-23 MED ORDER — TERBUTALINE SULFATE 1 MG/ML IJ SOLN: 0.2500 mg | Freq: Once | INTRAMUSCULAR | Status: DC | PRN

## 2019-02-23 MED ORDER — SOD CITRATE-CITRIC ACID 500-334 MG/5ML PO SOLN
30.0000 mL | ORAL | Status: DC | PRN
  Filled 2019-02-23: qty 30

## 2019-02-23 MED ORDER — LIDOCAINE HCL (PF) 1 % IJ SOLN: 30.0000 mL | INTRAMUSCULAR | Status: DC | PRN

## 2019-02-23 MED ORDER — SODIUM CHLORIDE 0.9 % IV SOLN: 5.0000 10*6.[IU] | Freq: Once | INTRAVENOUS | Status: DC

## 2019-02-23 MED ORDER — PHENYLEPHRINE 40 MCG/ML (10ML) SYRINGE FOR IV PUSH (FOR BLOOD PRESSURE SUPPORT): 80.0000 ug | PREFILLED_SYRINGE | INTRAVENOUS | Status: DC | PRN

## 2019-02-23 MED ORDER — FENTANYL-BUPIVACAINE-NACL 0.5-0.125-0.9 MG/250ML-% EP SOLN
12.0000 mL/h | EPIDURAL | Status: DC | PRN
  Filled 2019-02-23: qty 250

## 2019-02-23 MED ORDER — OXYTOCIN 40 UNITS IN NORMAL SALINE INFUSION - SIMPLE MED
2.5000 [IU]/h | INTRAVENOUS | Status: DC
  Filled 2019-02-23: qty 1000

## 2019-02-23 MED ORDER — ZIDOVUDINE 10 MG/ML IV SOLN
2.0000 mg/kg | Freq: Once | INTRAVENOUS | Status: AC
  Administered 2019-02-23: 12:00:00 160 mg via INTRAVENOUS
  Filled 2019-02-23: qty 16

## 2019-02-23 MED ORDER — LACTATED RINGERS IV SOLN
500.0000 mL | Freq: Once | INTRAVENOUS | Status: AC
  Administered 2019-02-24: 500 mL via INTRAVENOUS

## 2019-02-23 MED ORDER — EPHEDRINE 5 MG/ML INJ: 10.0000 mg | INTRAVENOUS | Status: DC | PRN

## 2019-02-23 MED ORDER — EMTRICITABINE-TENOFOVIR AF 200-25 MG PO TABS
1.0000 | ORAL_TABLET | Freq: Every day | ORAL | Status: DC
  Administered 2019-02-23 – 2019-02-28 (×6): 1 via ORAL
  Filled 2019-02-23 (×6): qty 1

## 2019-02-23 MED ORDER — OXYTOCIN 40 UNITS IN NORMAL SALINE INFUSION - SIMPLE MED
1.0000 m[IU]/min | INTRAVENOUS | Status: DC
  Administered 2019-02-23: 2 m[IU]/min via INTRAVENOUS

## 2019-02-23 MED ORDER — ACETAMINOPHEN 325 MG PO TABS
650.0000 mg | ORAL_TABLET | ORAL | Status: DC | PRN
  Administered 2019-02-24: 650 mg via ORAL
  Filled 2019-02-23: qty 2

## 2019-02-23 MED ORDER — MISOPROSTOL 25 MCG QUARTER TABLET
25.0000 ug | ORAL_TABLET | ORAL | Status: DC | PRN
  Administered 2019-02-23: 17:00:00 25 ug via VAGINAL
  Filled 2019-02-23: qty 1

## 2019-02-23 MED ORDER — DOLUTEGRAVIR SODIUM 50 MG PO TABS
50.0000 mg | ORAL_TABLET | Freq: Every day | ORAL | Status: DC
  Administered 2019-02-23 – 2019-02-28 (×6): 50 mg via ORAL
  Filled 2019-02-23 (×6): qty 1

## 2019-02-23 MED ORDER — PENICILLIN G 3 MILLION UNITS IVPB - SIMPLE MED: 3.0000 10*6.[IU] | INTRAVENOUS | Status: DC

## 2019-02-23 MED ORDER — OXYCODONE-ACETAMINOPHEN 5-325 MG PO TABS: 1.0000 | ORAL_TABLET | ORAL | Status: DC | PRN

## 2019-02-23 MED ORDER — ZIDOVUDINE 10 MG/ML IV SOLN
1.0000 mg/kg/h | INTRAVENOUS | Status: DC
  Administered 2019-02-23 – 2019-02-24 (×4): 1 mg/kg/h via INTRAVENOUS
  Filled 2019-02-23 (×4): qty 40

## 2019-02-23 MED ORDER — OXYCODONE-ACETAMINOPHEN 5-325 MG PO TABS: 2.0000 | ORAL_TABLET | ORAL | Status: DC | PRN

## 2019-02-23 MED ORDER — DIPHENHYDRAMINE HCL 50 MG/ML IJ SOLN: 12.5000 mg | INTRAMUSCULAR | Status: DC | PRN

## 2019-02-23 MED ORDER — LACTATED RINGERS IV SOLN
INTRAVENOUS | Status: DC
  Administered 2019-02-23 – 2019-02-24 (×7): via INTRAVENOUS

## 2019-02-23 MED ORDER — ONDANSETRON HCL 4 MG/2ML IJ SOLN: 4.0000 mg | Freq: Four times a day (QID) | INTRAMUSCULAR | Status: DC | PRN

## 2019-02-23 MED ORDER — LACTATED RINGERS IV SOLN
500.0000 mL | INTRAVENOUS | Status: DC | PRN
  Administered 2019-02-24: 500 mL via INTRAVENOUS
  Administered 2019-02-24 (×2): 1000 mL via INTRAVENOUS

## 2019-02-23 NOTE — Progress Notes (Signed)
LABOR PROGRESS NOTE  Marcia Long is a 38 y.o. T3S2876 at [redacted]w[redacted]d admitted for IOL for oligo.   Subjective: Met patient with [place] interpreter and RN at bedside. Discussed need for repeat cervical exam. Also discussed epidural. Patient intends to receive epidural when she is feeling pain with contractions. Currently not feeling any abdominal pain. No questions.   Objective: BP 99/63   Pulse 68   Temp 98.3 F (36.8 C) (Oral)   Resp 16   Ht 5' 5.5" (1.664 m)   Wt 79.8 kg   LMP 06/23/2018 (Approximate)   BMI 28.84 kg/m  or  Vitals:   02/23/19 1550 02/23/19 1836 02/23/19 1925 02/23/19 2011  BP: 104/67 105/60 105/74 99/63  Pulse: 73 79 82 68  Resp: 18 18 16 16   Temp: 98.3 F (36.8 C) 98.3 F (36.8 C)    TempSrc: Oral Oral    Weight:      Height:        Dilation: 3 Effacement (%): 70 Cervical Position: Middle Station: -2 Presentation: Vertex Exam by:: JCurly Shores RN FHT: baseline rate 145, moderate varibility, +acel, no decel Toco: q4-6 min   Labs: Lab Results  Component Value Date   WBC 4.5 02/23/2019   HGB 11.5 (L) 02/23/2019   HCT 34.4 (L) 02/23/2019   MCV 87.1 02/23/2019   PLT 143 (L) 02/23/2019    Patient Active Problem List   Diagnosis Date Noted  . Oligohydramnios in third trimester 02/23/2019  . HIV [place] virus [place]) [place] 11/30/2018  . Chronic dental pain 11/30/2018  . Supervision of high risk pregnancy, antepartum 11/03/2018  . HIV (human immunodeficiency virus) risk factors complicating pregnancy 081/15/7262 . Language barrier 11/03/2018  . AMA (advanced maternal age) multigravida 35+ 11/03/2018    Assessment / Plan: 38y.o. G6P5005 at 367w1dere for IOL for oligohydramnios. Pregnancy complicated HIV, undetectable viral load. Patient receiving AZT and home medications.    Labor: Induction. Patient s/p cyto x1. Cervix favorable and multiparous so will start Pitocin.  Fetal Wellbeing:  Cat I  Pain Control:  Plans for  epidural  Anticipated MOD:  NSVD   CaPhill MyronD.O. OB Fellow  02/23/2019, 8:51 PM

## 2019-02-23 NOTE — H&P (Signed)
Marcia Long is a 38 y.o. female E8B1517 with IUP at [redacted]w[redacted]d presenting for IOL for oligohydramnios. [facility], transferred from HD  Prenatal History/Complications:  HIV +, undetectable viral load AFI 3.79 (<3%) 02/22/19 SVD at term X4, no problems   Past Medical History: Past Medical History:  Diagnosis Date  . HIV (human immunodeficiency virus infection) ([facility])     Past Surgical History: Past Surgical History:  Procedure Laterality Date  . NO PAST SURGERIES      Obstetrical History: OB History    Gravida  6   Para  5   Term  5   Preterm      AB      Living  5     SAB      TAB      Ectopic      Multiple      Live Births  5           Social History: Social History   Socioeconomic History  . Marital status: Married    Spouse name: Not on file  . Number of children: Not on file  . Years of education: Not on file  . Highest education level: Never attended school  Occupational History  . Not on file  Social Needs  . Financial resource strain: Not on file  . Food insecurity:    Worry: Often true    Inability: Often true  . Transportation needs:    Medical: Yes    Non-medical: Yes  Tobacco Use  . Smoking status: Never Smoker  . Smokeless tobacco: Never Used  Substance and Sexual Activity  . Alcohol use: Not Currently  . Drug use: Not Currently  . Sexual activity: Yes    Birth control/protection: None  Lifestyle  . Physical activity:    Days per week: Not on file    Minutes per session: Not on file  . Stress: Not on file  Relationships  . Social connections:    Talks on phone: Not on file    Gets together: Not on file    Attends religious service: Not on file    Active member of club or organization: Not on file    Attends meetings of clubs or organizations: Not on file    Relationship status: Not on file  Other Topics Concern  . Not on file  Social History Narrative  . Not on file    Family History: No family history  on file.  Allergies: No Known Allergies  Medications Prior to Admission  Medication Sig Dispense Refill Last Dose  . dolutegravir (TIVICAY) 50 MG tablet Take 1 tablet (50 mg total) by mouth daily. 30 tablet 11 Taking  . Elastic Bandages & Supports (COMFORT FIT MATERNITY SUPP MED) MISC Wear daily when ambulating 1 each 0 Taking  . emtricitabine-tenofovir (TRUVADA) 200-300 MG tablet Take 1 tablet by mouth daily. 30 tablet 11 Taking  . Prenat-FeCbn-FeAsp-Meth-FA-DHA (PRENATE MINI) 18-0.6-0.4-350 MG CAPS Take 1 tablet by mouth daily. 30 capsule 12 Taking  . prenatal vitamin w/FE, FA (PRENATAL 1 + 1) 27-1 MG TABS tablet Take 1 tablet by mouth daily at 12 noon.   Taking        Review of Systems   Constitutional: Negative for fever and chills Eyes: Negative for visual disturbances Respiratory: Negative for shortness of breath, dyspnea Cardiovascular: Negative for chest pain or palpitations  Gastrointestinal: Negative for abdominal pain, vomiting, diarrhea and constipation.   Genitourinary: Negative for dysuria and urgency Musculoskeletal: Negative for  back pain, joint pain, myalgias  Neurological: Negative for dizziness and headaches   Nursing Staff Provider  [facility] Dating  23 week sono  Language  [place] Anatomy US   normal  Flu Vaccine   12/19 Genetic Screen  NIPS: normal        Quad: elevated    TDaP vaccine   deferred until viral loads available Hgb A1C or  GTT Early  Third trimester GDM  Rhogam     LAB RESULTS   Feeding Plan Formula Blood Type A/Positive/-- (12/23 0000)   Contraception BTL/depo PP Antibody Negative (12/23 0000)  Circumcision  Rubella    Pediatrician   RPR NON-REACTIVE (01/21 0929)   Support Person  HBsAg NON-REACTIVE (01/21 0929)   Prenatal Classes  HIV REPEATEDLY REACTIVE (01/21 0929)Positive  BTL Consent  GBS  (For PCN allergy, check sensitivities)   VBAC Consent  Pap Negative (09/2018)    Hgb Electro      CF     SMA      Waterbirth  [ ]  Class [ ]  Consent [ ]  CNM visit      Last menstrual period 06/23/2018. General appearance: alert, cooperative and no distress Lungs: clear to auscultation bilaterally Heart: regular rate and rhythm Abdomen: soft, non-tender; bowel sounds normal Extremities: Homans sign is negative, no sign of DVT DTR's 2+ Presentation: cephalic Fetal monitoring  Baseline: 140 bpm, Variability: Good {> 6 bpm), Accelerations: Reactive and Decelerations: Absent Uterine activity  Rare ctx      Prenatal labs: ABO, Rh: A/Positive/-- (12/23 0000) Antibody: Negative (12/23 0000) Rubella: immune RPR: Non Reactive (02/20 0832)  HBsAg: NON-REACTIVE (01/21 0929)  HIV: Reactive (02/20 [id number])    Prenatal Transfer Tool  Maternal Diabetes: No Genetic Screening: Normal Maternal Ultrasounds/Referrals: Oligohydramnios yesterday  Fetal Ultrasounds or other Referrals:  None Maternal Substance Abuse:  No Significant Maternal Medications:  None Significant Maternal Lab Results: GBS unknown, pending results    No results found for this or any previous visit (from the past 24 hour(s)).  Assessment: Marcia Long is a 38 y.o. J1B1478G6P5005 with an IUP at 438w1d presenting for IOL for oligohydramnios at term.  Plan: #Labor: Cytotec->Foley->pitocin #Pain:  Per request #FWB Cat 1 #ID: GBS: unknown, PCR pending  No know prior hx of GBS colonization, so will await results ;  AZT four hours prior to starting IOL #MOF:  bottle #MOC: wants BTL eventually, will get depo in hospital #Circ: no   Marcia Long 02/23/2019, 10:55 AM

## 2019-02-23 NOTE — Progress Notes (Signed)
Admission process completed and patient fully oriented to room & unit procedures, policies, using interpreter services.Made appointment with [facility] using reference # [id number]. Called again at time of appointment for [facility] interpreter with verification # [phone]. Patient aware of plan of care and times of future medications due with verbal understanding of all. Made sure all patients questions were asked before ending call with interpreter.

## 2019-02-23 NOTE — Progress Notes (Signed)
Most of admission process completed with help of [facility] interpreter verification # [phone]. Also CNM was able to come in during this time & speak with patient, to explain plan of care and perform vaginal exam. Patient was also given opportunity to ask questions.  Will call back to complete admission process, as interpreter had to hang up for prior appointment. Patient verbalized understanding of all that was explained to her.

## 2019-02-24 ENCOUNTER — Encounter (HOSPITAL_COMMUNITY)
Admission: AD | Disposition: A | Discharge status: Home / Self Care | Payer: Self-pay | Source: Home / Self Care | Attending: Obstetrics and Gynecology

## 2019-02-24 ENCOUNTER — Inpatient Hospital Stay (HOSPITAL_COMMUNITY): Payer: Medicaid Other | Admitting: Anesthesiology

## 2019-02-24 ENCOUNTER — Encounter (HOSPITAL_COMMUNITY): Payer: Self-pay | Admitting: Neonatology

## 2019-02-24 DIAGNOSIS — O4103X0 Oligohydramnios, third trimester, not applicable or unspecified: Secondary | ICD-10-CM

## 2019-02-24 DIAGNOSIS — Z21 Asymptomatic human immunodeficiency virus [HIV] infection status: Secondary | ICD-10-CM

## 2019-02-24 DIAGNOSIS — Z302 Encounter for sterilization: Secondary | ICD-10-CM

## 2019-02-24 DIAGNOSIS — Z3A39 39 weeks gestation of pregnancy: Secondary | ICD-10-CM

## 2019-02-24 DIAGNOSIS — Z3490 Encounter for supervision of normal pregnancy, unspecified, unspecified trimester: Secondary | ICD-10-CM | POA: Diagnosis present

## 2019-02-24 DIAGNOSIS — O9872 Human immunodeficiency virus [HIV] disease complicating childbirth: Secondary | ICD-10-CM

## 2019-02-24 DIAGNOSIS — O411290 Chorioamnionitis, unspecified trimester, not applicable or unspecified: Secondary | ICD-10-CM | POA: Diagnosis not present

## 2019-02-24 DIAGNOSIS — Z641 Problems related to multiparity: Secondary | ICD-10-CM

## 2019-02-24 LAB — RPR, QUANT+TP ABS (REFLEX)
Rapid Plasma Reagin, Quant: 1:1 {titer} — ABNORMAL HIGH
T Pallidum Abs: NONREACTIVE

## 2019-02-24 LAB — RUBELLA SCREEN: Rubella: 15.9 index (ref >0.99)

## 2019-02-24 LAB — RPR: RPR Ser Ql: REACTIVE — AB

## 2019-02-24 LAB — ABO/RH: ABO/RH(D): A POS

## 2019-02-24 SURGERY — Surgical Case
Anesthesia: Epidural | Site: Abdomen | Wound class: Clean Contaminated

## 2019-02-24 MED ORDER — ONDANSETRON HCL 4 MG/2ML IJ SOLN
INTRAMUSCULAR | Status: AC
  Filled 2019-02-24: qty 2

## 2019-02-24 MED ORDER — NALOXONE HCL 4 MG/10ML IJ SOLN
1.0000 ug/kg/h | INTRAVENOUS | Status: DC | PRN
  Filled 2019-02-24: qty 5

## 2019-02-24 MED ORDER — SODIUM CHLORIDE 0.9% FLUSH: 3.0000 mL | INTRAVENOUS | Status: DC | PRN

## 2019-02-24 MED ORDER — LIDOCAINE HCL (PF) 1 % IJ SOLN
INTRAMUSCULAR | Status: DC | PRN
  Administered 2019-02-24: 4 mL via EPIDURAL
  Administered 2019-02-24: 5 mL via EPIDURAL

## 2019-02-24 MED ORDER — NALBUPHINE HCL 10 MG/ML IJ SOLN: 5.0000 mg | Freq: Once | INTRAMUSCULAR | Status: DC | PRN

## 2019-02-24 MED ORDER — STERILE WATER FOR IRRIGATION IR SOLN
Status: DC | PRN
  Administered 2019-02-24: 1

## 2019-02-24 MED ORDER — MORPHINE SULFATE (PF) 0.5 MG/ML IJ SOLN
INTRAMUSCULAR | Status: AC
  Filled 2019-02-24: qty 10

## 2019-02-24 MED ORDER — LIDOCAINE-EPINEPHRINE (PF) 2 %-1:200000 IJ SOLN
INTRAMUSCULAR | Status: DC | PRN
  Administered 2019-02-24 (×2): 5 mL via EPIDURAL

## 2019-02-24 MED ORDER — SODIUM CHLORIDE 0.9 % IV SOLN
INTRAVENOUS | Status: DC | PRN
  Administered 2019-02-24: 14:00:00 via INTRAVENOUS

## 2019-02-24 MED ORDER — HYDROMORPHONE HCL 1 MG/ML IJ SOLN
0.2500 mg | INTRAMUSCULAR | Status: DC | PRN
  Administered 2019-02-24 (×2): 0.25 mg via INTRAVENOUS

## 2019-02-24 MED ORDER — DIPHENHYDRAMINE HCL 25 MG PO CAPS: 25.0000 mg | ORAL_CAPSULE | ORAL | Status: DC | PRN

## 2019-02-24 MED ORDER — LACTATED RINGERS AMNIOINFUSION
INTRAVENOUS | Status: DC
  Administered 2019-02-24: 12:00:00 via INTRAUTERINE

## 2019-02-24 MED ORDER — CEFAZOLIN SODIUM-DEXTROSE 2-4 GM/100ML-% IV SOLN
INTRAVENOUS | Status: AC
  Filled 2019-02-24: qty 100

## 2019-02-24 MED ORDER — CLINDAMYCIN PHOSPHATE 900 MG/50ML IV SOLN
INTRAVENOUS | Status: AC
  Filled 2019-02-24: qty 50

## 2019-02-24 MED ORDER — SIMETHICONE 80 MG PO CHEW: 80.0000 mg | CHEWABLE_TABLET | ORAL | Status: DC | PRN

## 2019-02-24 MED ORDER — SODIUM CHLORIDE 0.9 % IV SOLN
2.0000 g | Freq: Four times a day (QID) | INTRAVENOUS | Status: AC
  Administered 2019-02-24 – 2019-02-25 (×4): 2 g via INTRAVENOUS
  Filled 2019-02-24 (×4): qty 2000

## 2019-02-24 MED ORDER — KETOROLAC TROMETHAMINE 30 MG/ML IJ SOLN
15.0000 mg | Freq: Four times a day (QID) | INTRAMUSCULAR | Status: AC
  Administered 2019-02-24 – 2019-02-25 (×4): 15 mg via INTRAVENOUS
  Filled 2019-02-24 (×4): qty 1

## 2019-02-24 MED ORDER — SIMETHICONE 80 MG PO CHEW
80.0000 mg | CHEWABLE_TABLET | ORAL | Status: DC
  Administered 2019-02-25 – 2019-02-27 (×4): 80 mg via ORAL
  Filled 2019-02-24 (×4): qty 1

## 2019-02-24 MED ORDER — DEXAMETHASONE SODIUM PHOSPHATE 10 MG/ML IJ SOLN
INTRAMUSCULAR | Status: DC | PRN
  Administered 2019-02-24: 10 mg via INTRAVENOUS

## 2019-02-24 MED ORDER — SCOPOLAMINE 1 MG/3DAYS TD PT72: 1.0000 | MEDICATED_PATCH | Freq: Once | TRANSDERMAL | Status: DC

## 2019-02-24 MED ORDER — ONDANSETRON HCL 4 MG/2ML IJ SOLN
INTRAMUSCULAR | Status: DC | PRN
  Administered 2019-02-24: 4 mg via INTRAVENOUS

## 2019-02-24 MED ORDER — CEFAZOLIN SODIUM-DEXTROSE 2-4 GM/100ML-% IV SOLN
2.0000 g | INTRAVENOUS | Status: AC
  Administered 2019-02-24: 2 g via INTRAVENOUS

## 2019-02-24 MED ORDER — GENTAMICIN SULFATE 40 MG/ML IJ SOLN: 5.0000 mg/kg | INTRAVENOUS | Status: DC

## 2019-02-24 MED ORDER — MENTHOL 3 MG MT LOZG: 1.0000 | LOZENGE | OROMUCOSAL | Status: DC | PRN

## 2019-02-24 MED ORDER — MEPERIDINE HCL 25 MG/ML IJ SOLN: 6.2500 mg | INTRAMUSCULAR | Status: DC | PRN

## 2019-02-24 MED ORDER — NALOXONE HCL 0.4 MG/ML IJ SOLN: 0.4000 mg | INTRAMUSCULAR | Status: DC | PRN

## 2019-02-24 MED ORDER — ZOLPIDEM TARTRATE 5 MG PO TABS: 5.0000 mg | ORAL_TABLET | Freq: Every evening | ORAL | Status: DC | PRN

## 2019-02-24 MED ORDER — NALBUPHINE HCL 10 MG/ML IJ SOLN: 5.0000 mg | INTRAMUSCULAR | Status: DC | PRN

## 2019-02-24 MED ORDER — PRENATAL MULTIVITAMIN CH
1.0000 | ORAL_TABLET | Freq: Every day | ORAL | Status: DC
  Administered 2019-02-26 – 2019-02-27 (×2): 1 via ORAL
  Filled 2019-02-24 (×2): qty 1

## 2019-02-24 MED ORDER — HYDROMORPHONE HCL 1 MG/ML IJ SOLN
INTRAMUSCULAR | Status: AC
  Filled 2019-02-24: qty 0.5

## 2019-02-24 MED ORDER — IBUPROFEN 600 MG PO TABS
600.0000 mg | ORAL_TABLET | Freq: Four times a day (QID) | ORAL | Status: DC | PRN
  Administered 2019-02-25 – 2019-02-27 (×10): 600 mg via ORAL
  Filled 2019-02-24 (×11): qty 1

## 2019-02-24 MED ORDER — FENTANYL-BUPIVACAINE-NACL 0.5-0.125-0.9 MG/250ML-% EP SOLN: 12.0000 mL/h | EPIDURAL | Status: DC | PRN

## 2019-02-24 MED ORDER — SODIUM CHLORIDE 0.9 % IV SOLN
2.0000 g | Freq: Four times a day (QID) | INTRAVENOUS | Status: DC
  Administered 2019-02-24: 2 g via INTRAVENOUS
  Filled 2019-02-24: qty 2000

## 2019-02-24 MED ORDER — ACETAMINOPHEN 500 MG PO TABS
1000.0000 mg | ORAL_TABLET | Freq: Four times a day (QID) | ORAL | Status: AC
  Administered 2019-02-24 – 2019-02-25 (×3): 1000 mg via ORAL
  Filled 2019-02-24 (×4): qty 2

## 2019-02-24 MED ORDER — SIMETHICONE 80 MG PO CHEW
80.0000 mg | CHEWABLE_TABLET | Freq: Three times a day (TID) | ORAL | Status: DC
  Administered 2019-02-24 – 2019-02-28 (×8): 80 mg via ORAL
  Filled 2019-02-24 (×9): qty 1

## 2019-02-24 MED ORDER — LIDOCAINE 2% (20 MG/ML) 5 ML SYRINGE
INTRAMUSCULAR | Status: AC
  Filled 2019-02-24: qty 5

## 2019-02-24 MED ORDER — OXYTOCIN 40 UNITS IN NORMAL SALINE INFUSION - SIMPLE MED: 2.5000 [IU]/h | INTRAVENOUS | Status: AC

## 2019-02-24 MED ORDER — OXYCODONE HCL 5 MG/5ML PO SOLN: 5.0000 mg | Freq: Once | ORAL | Status: DC | PRN

## 2019-02-24 MED ORDER — OXYCODONE HCL 5 MG PO TABS: 5.0000 mg | ORAL_TABLET | Freq: Once | ORAL | Status: DC | PRN

## 2019-02-24 MED ORDER — SODIUM CHLORIDE 0.9 % IV SOLN: 2.0000 g | Freq: Four times a day (QID) | INTRAVENOUS | Status: DC

## 2019-02-24 MED ORDER — MORPHINE SULFATE (PF) 0.5 MG/ML IJ SOLN
INTRAMUSCULAR | Status: DC | PRN
  Administered 2019-02-24: 3 mg via EPIDURAL

## 2019-02-24 MED ORDER — CLINDAMYCIN PHOSPHATE 900 MG/50ML IV SOLN
900.0000 mg | Freq: Three times a day (TID) | INTRAVENOUS | Status: DC
  Administered 2019-02-24: 900 mg via INTRAVENOUS
  Filled 2019-02-24 (×2): qty 50

## 2019-02-24 MED ORDER — TETANUS-DIPHTH-ACELL PERTUSSIS 5-2.5-18.5 LF-MCG/0.5 IM SUSP: 0.5000 mL | Freq: Once | INTRAMUSCULAR | Status: DC

## 2019-02-24 MED ORDER — GENTAMICIN SULFATE 40 MG/ML IJ SOLN
5.0000 mg/kg | INTRAVENOUS | Status: DC
  Administered 2019-02-24: 400 mg via INTRAVENOUS
  Filled 2019-02-24 (×3): qty 10

## 2019-02-24 MED ORDER — SODIUM CHLORIDE (PF) 0.9 % IJ SOLN
INTRAMUSCULAR | Status: DC | PRN
  Administered 2019-02-24: 14 mL/h via EPIDURAL

## 2019-02-24 MED ORDER — COCONUT OIL OIL: 1.0000 "application " | TOPICAL_OIL | Status: DC | PRN

## 2019-02-24 MED ORDER — SODIUM CHLORIDE 0.9 % IV SOLN
2.0000 g | Freq: Four times a day (QID) | INTRAVENOUS | Status: DC
  Filled 2019-02-24 (×4): qty 2000

## 2019-02-24 MED ORDER — ENOXAPARIN SODIUM 40 MG/0.4ML ~~LOC~~ SOLN
40.0000 mg | SUBCUTANEOUS | Status: DC
  Administered 2019-02-25 – 2019-02-28 (×4): 40 mg via SUBCUTANEOUS
  Filled 2019-02-24 (×4): qty 0.4

## 2019-02-24 MED ORDER — SODIUM CHLORIDE 0.9 % IR SOLN
Status: DC | PRN
  Administered 2019-02-24: 1

## 2019-02-24 MED ORDER — SOD CITRATE-CITRIC ACID 500-334 MG/5ML PO SOLN
30.0000 mL | ORAL | Status: AC
  Administered 2019-02-24: 30 mL via ORAL

## 2019-02-24 MED ORDER — KETOROLAC TROMETHAMINE 30 MG/ML IJ SOLN: 30.0000 mg | Freq: Once | INTRAMUSCULAR | Status: DC | PRN

## 2019-02-24 MED ORDER — PROMETHAZINE HCL 25 MG/ML IJ SOLN: 6.2500 mg | INTRAMUSCULAR | Status: DC | PRN

## 2019-02-24 MED ORDER — DIBUCAINE (PERIANAL) 1 % EX OINT: 1.0000 "application " | TOPICAL_OINTMENT | CUTANEOUS | Status: DC | PRN

## 2019-02-24 MED ORDER — DEXAMETHASONE SODIUM PHOSPHATE 10 MG/ML IJ SOLN
INTRAMUSCULAR | Status: AC
  Filled 2019-02-24: qty 1

## 2019-02-24 MED ORDER — DIPHENHYDRAMINE HCL 25 MG PO CAPS: 25.0000 mg | ORAL_CAPSULE | Freq: Four times a day (QID) | ORAL | Status: DC | PRN

## 2019-02-24 MED ORDER — SENNOSIDES-DOCUSATE SODIUM 8.6-50 MG PO TABS
2.0000 | ORAL_TABLET | ORAL | Status: DC
  Administered 2019-02-25 – 2019-02-27 (×4): 2 via ORAL
  Filled 2019-02-24 (×4): qty 2

## 2019-02-24 MED ORDER — CLINDAMYCIN PHOSPHATE 900 MG/50ML IV SOLN
900.0000 mg | Freq: Three times a day (TID) | INTRAVENOUS | Status: AC
  Administered 2019-02-24 – 2019-02-25 (×3): 900 mg via INTRAVENOUS
  Filled 2019-02-24 (×3): qty 50

## 2019-02-24 MED ORDER — SODIUM CHLORIDE 0.9 % IV SOLN
INTRAVENOUS | Status: DC | PRN
  Administered 2019-02-24: 40 [IU] via INTRAVENOUS

## 2019-02-24 MED ORDER — OXYCODONE-ACETAMINOPHEN 5-325 MG PO TABS
1.0000 | ORAL_TABLET | ORAL | Status: DC | PRN
  Administered 2019-02-25 – 2019-02-28 (×6): 1 via ORAL
  Filled 2019-02-24 (×6): qty 1

## 2019-02-24 MED ORDER — WITCH HAZEL-GLYCERIN EX PADS: 1.0000 "application " | MEDICATED_PAD | CUTANEOUS | Status: DC | PRN

## 2019-02-24 MED ORDER — ONDANSETRON HCL 4 MG/2ML IJ SOLN
4.0000 mg | Freq: Three times a day (TID) | INTRAMUSCULAR | Status: DC | PRN
  Administered 2019-02-25 (×2): 4 mg via INTRAVENOUS
  Filled 2019-02-24 (×2): qty 2

## 2019-02-24 MED ORDER — LACTATED RINGERS IV SOLN
INTRAVENOUS | Status: DC
  Administered 2019-02-25 (×2): via INTRAVENOUS

## 2019-02-24 MED ORDER — DIPHENHYDRAMINE HCL 50 MG/ML IJ SOLN: 12.5000 mg | INTRAMUSCULAR | Status: DC | PRN

## 2019-02-24 MED ORDER — OXYTOCIN 40 UNITS IN NORMAL SALINE INFUSION - SIMPLE MED
INTRAVENOUS | Status: AC
  Filled 2019-02-24: qty 1000

## 2019-02-24 SURGICAL SUPPLY — 36 items
BENZOIN TINCTURE PRP APPL 2/3 (GAUZE/BANDAGES/DRESSINGS) ×2 IMPLANT
CANISTER SUCT 3000ML PPV (MISCELLANEOUS) ×2 IMPLANT
CHLORAPREP W/TINT 26ML (MISCELLANEOUS) ×2 IMPLANT
CLOSURE STERI STRIP 1/2 X4 (GAUZE/BANDAGES/DRESSINGS) ×2 IMPLANT
DRSG OPSITE POSTOP 4X10 (GAUZE/BANDAGES/DRESSINGS) ×2 IMPLANT
ELECT REM PT RETURN 9FT ADLT (ELECTROSURGICAL) ×2
ELECTRODE REM PT RTRN 9FT ADLT (ELECTROSURGICAL) ×1 IMPLANT
EXTRACTOR VACUUM KIWI (MISCELLANEOUS) ×2 IMPLANT
GLOVE BIOGEL PI IND STRL 7.0 (GLOVE) ×2 IMPLANT
GLOVE BIOGEL PI IND STRL 7.5 (GLOVE) ×1 IMPLANT
GLOVE BIOGEL PI INDICATOR 7.0 (GLOVE) ×2
GLOVE BIOGEL PI INDICATOR 7.5 (GLOVE) ×1
GLOVE SKINSENSE NS SZ7.0 (GLOVE) ×1
GLOVE SKINSENSE STRL SZ7.0 (GLOVE) ×1 IMPLANT
GOWN STRL REUS W/ TWL LRG LVL3 (GOWN DISPOSABLE) ×2 IMPLANT
GOWN STRL REUS W/ TWL XL LVL3 (GOWN DISPOSABLE) ×1 IMPLANT
GOWN STRL REUS W/TWL LRG LVL3 (GOWN DISPOSABLE) ×2
GOWN STRL REUS W/TWL XL LVL3 (GOWN DISPOSABLE) ×1
NS IRRIG 1000ML POUR BTL (IV SOLUTION) ×2 IMPLANT
PACK C SECTION WH (CUSTOM PROCEDURE TRAY) ×2 IMPLANT
PAD ABD 7.5X8 STRL (GAUZE/BANDAGES/DRESSINGS) ×2 IMPLANT
PAD OB MATERNITY 4.3X12.25 (PERSONAL CARE ITEMS) ×2 IMPLANT
PAD PREP 24X48 CUFFED NSTRL (MISCELLANEOUS) ×2 IMPLANT
PENCIL SMOKE EVAC W/HOLSTER (ELECTROSURGICAL) ×2 IMPLANT
SPONGE LAP 18X18 X RAY DECT (DISPOSABLE) ×2 IMPLANT
STRIP CLOSURE SKIN 1/2X4 (GAUZE/BANDAGES/DRESSINGS) ×2 IMPLANT
SUT MNCRL 0 VIOLET CTX 36 (SUTURE) ×2 IMPLANT
SUT MON AB 4-0 PS1 27 (SUTURE) ×2 IMPLANT
SUT MONOCRYL 0 CTX 36 (SUTURE) ×2
SUT PLAIN 2 0 XLH (SUTURE) ×2 IMPLANT
SUT VIC AB 0 CT1 36 (SUTURE) ×4 IMPLANT
SUT VIC AB 3-0 CT1 27 (SUTURE) ×6
SUT VIC AB 3-0 CT1 TAPERPNT 27 (SUTURE) ×6 IMPLANT
SUT VIC AB 4-0 KS 27 (SUTURE) ×2 IMPLANT
TOWEL OR 17X24 6PK STRL BLUE (TOWEL DISPOSABLE) ×4 IMPLANT
WATER STERILE IRR 1000ML POUR (IV SOLUTION) ×2 IMPLANT

## 2019-02-24 NOTE — Anesthesia Postprocedure Evaluation (Signed)
Anesthesia Post Note  Patient: Marcia Long  Procedure(s) Performed: CESAREAN SECTION (N/A Abdomen)     Patient location during evaluation: Mother Baby Anesthesia Type: Epidural Level of consciousness: awake and alert Pain management: pain level controlled Vital Signs Assessment: post-procedure vital signs reviewed and stable Respiratory status: spontaneous breathing, nonlabored ventilation and respiratory function stable Cardiovascular status: stable Postop Assessment: no headache, no backache and epidural receding Anesthetic complications: no    Last Vitals:  Vitals:   02/24/19 1630 02/24/19 1652  BP: 133/71 122/86  Pulse: (!) 56 66  Resp: 16 18  Temp:  36.7 C  SpO2: 94% 97%    Last Pain:  Vitals:   02/24/19 1652  TempSrc: Oral  PainSc:    Pain Goal:                Epidural/Spinal Function Cutaneous sensation: Able to Wiggle Toes (02/24/19 1655), Patient able to flex knees: Yes (02/24/19 1655), Patient able to lift hips off bed: No (02/24/19 1655), Back pain beyond tenderness at insertion site: No (02/24/19 1655), Progressively worsening motor and/or sensory loss: No (02/24/19 1655)  Lowella Curb

## 2019-02-24 NOTE — Progress Notes (Signed)
LABOR PROGRESS NOTE  Marcia Long is a 38 y.o. [id number] at [redacted]w[redacted]d  admitted for IOL for oligo.   Subjective: Patient comfortable with epidural. Came to room to perform cervical exam.   Objective: BP 109/71   Pulse (!) 58   Temp 97.9 F (36.6 C) (Axillary)   Resp 17   Ht 5' 5.5" (1.664 m)   Wt 79.8 kg   LMP 06/23/2018 (Approximate)   BMI 28.84 kg/m  or  Vitals:   02/24/19 0321 02/24/19 0325 02/24/19 0401 02/24/19 0431  BP: 111/67 106/67 105/68 109/71  Pulse: 63 63 (!) 54 (!) 58  Resp: 17 17 17 17   Temp:    97.9 F (36.6 C)  TempSrc:    Axillary  Weight:      Height:        Dilation: 10 Dilation Complete Date: 02/24/19 Dilation Complete Time: 0424 Effacement (%): 90 Cervical Position: Middle Station: Plus 1 Presentation: Vertex Exam by:: Dr. Earlene Plater FHT: baseline rate 140, moderate varibility, +acel, variable decel Toco: q1-3 min   Labs: Lab Results  Component Value Date   WBC 4.5 02/23/2019   HGB 11.5 (L) 02/23/2019   HCT 34.4 (L) 02/23/2019   MCV 87.1 02/23/2019   PLT 143 (L) 02/23/2019    Patient Active Problem List   Diagnosis Date Noted  . Oligohydramnios in third trimester 02/23/2019  . HIV (human immunodeficiency virus infection) ([facility]) 11/30/2018  . Chronic dental pain 11/30/2018  . Supervision of high risk pregnancy, antepartum 11/03/2018  . HIV (human immunodeficiency virus) risk factors complicating pregnancy 11/03/2018  . Language barrier 11/03/2018  . AMA (advanced maternal age) multigravida 35+ 11/03/2018    Assessment / Plan: 38 y.o. G6P5005 at [redacted]w[redacted]d here for IOL for oligohydramnios. Pregnancy complicated HIV, undetectable viral load. Patient receiving AZT and home medications.    Labor: On 8 mu/min Pitocin. Patient now complete with SROM during exam.  Fetal Wellbeing:  Cat II but overall reassuring with moderate variability  Pain Control:  Epidural in place  Anticipated MOD:  NSVD   [name], D.O. OB Fellow  02/24/2019, 4:37  AM

## 2019-02-24 NOTE — Transfer of Care (Signed)
Immediate Anesthesia Transfer of Care Note  Patient: Marcia Long  Procedure(s) Performed: CESAREAN SECTION (N/A Abdomen)  Patient Location: PACU  Anesthesia Type:Epidural  Level of Consciousness: awake and alert   Airway & Oxygen Therapy: Patient Spontanous Breathing  Post-op Assessment: Report given to RN and Post -op Vital signs reviewed and stable  Post vital signs: Reviewed and stable  Last Vitals:  Vitals Value Taken Time  BP 106/65 02/24/2019  2:41 PM  Temp    Pulse 58 02/24/2019  2:44 PM  Resp 18 02/24/2019  2:44 PM  SpO2 92 % 02/24/2019  2:44 PM  Vitals shown include unvalidated device data.  Last Pain:  Vitals:   02/24/19 1031  TempSrc: Axillary  PainSc:          Complications: No apparent anesthesia complications

## 2019-02-24 NOTE — Progress Notes (Signed)
OB Note Fetus tolerating pushing better. Baseline 160s, moderate variability and less prominent late decels and having some early decels too. UCs q2-9m. Abx going  SVE unchanged with BPD at 0 to -1 and feels OP. I don't feel it appropriate to attempt operative vaginal delivery at such a high station and the fact that the fetus hasn't come down any is concerning. Recommend c/s for arrest of descent. Pt has BTL papers that are UTD. I asked her if she would like permanent surgery to not have any more kids and she said yes. Daughter used since interpreter not available  Lamar Sprinkles barrier still a problem  Can proceed to OR when it is ready. Will do 24h of abx post op  Cornelia Copa MD Attending Center for [employer] (Faculty Practice) 02/24/2019 Time: 1248pm

## 2019-02-24 NOTE — Anesthesia Preprocedure Evaluation (Signed)
Anesthesia Evaluation  Patient identified by MRN, date of birth, ID band Patient awake    Reviewed: Allergy & Precautions, NPO status , Patient's Chart, lab work & pertinent test results  History of Anesthesia Complications Negative for: history of anesthetic complications  Airway Mallampati: II  TM Distance: >3 FB Neck ROM: Full    Dental   Pulmonary neg pulmonary ROS,    breath sounds clear to auscultation       Cardiovascular negative cardio ROS   Rhythm:Regular Rate:Normal     Neuro/Psych negative neurological ROS  negative psych ROS   GI/Hepatic negative GI ROS, Neg liver ROS,   Endo/Other  negative endocrine ROS  Renal/GU negative Renal ROS     Musculoskeletal negative musculoskeletal ROS (+)   Abdominal   Peds  Hematology  (+) anemia , HIV,  PLT 143k   Anesthesia Other Findings   Reproductive/Obstetrics                             Anesthesia Physical Anesthesia Plan  ASA: II  Anesthesia Plan: Epidural   Post-op Pain Management:    Induction:   PONV Risk Score and Plan: 2 and Treatment may vary due to age or medical condition  Airway Management Planned: Natural Airway  Additional Equipment: None  Intra-op Plan:   Post-operative Plan:   Informed Consent: I have reviewed the patients History and Physical, chart, labs and discussed the procedure including the risks, benefits and alternatives for the proposed anesthesia with the patient or authorized representative who has indicated his/her understanding and acceptance.       Plan Discussed with: Anesthesiologist  Anesthesia Plan Comments: (Labs reviewed. Platelets acceptable, patient not taking any blood thinning medications. Per RN, FHR tracing reported to be stable enough for sitting procedure. Risks and benefits discussed with patient, including PDPH, backache, epidural hematoma, failed epidural, allergic  reaction, and nerve injury. Patient expressed understanding and wished to proceed.)        Anesthesia Quick Evaluation

## 2019-02-24 NOTE — Op Note (Addendum)
Operative Note   SURGERY DATE: 02/24/2019  PRE-OP DIAGNOSIS:  *Pregnancy at 4334w2d * Arrest of descent * Unwanted fertility  * HIV positive * Maternal fever during labor * Language barrier * Oligohydramnios * Advanced maternal age * Grand multiparity  POST-OP DIAGNOSIS: Same   PROCEDURE: primary low transverse cesarean section via pfannenstiel skin incision with double layer uterine closure  SURGEON: Surgeon(s) and Role:    * [place] BingPickens, Aza Dantes, MD - Primary    * Gwenevere AbbotPhillip, Nimeka, MD - Fellow  ASSISTANT: Gwenevere AbbotNimeka Phillip, MD   ANESTHESIA: epidural  ESTIMATED BLOOD LOSS: 310ml  DRAINS: 300mL UOP via indwelling foley  TOTAL IV FLUIDS: 1700mL crystalloid  VTE PROPHYLAXIS: SCDs to bilateral lower extremities  ANTIBIOTICS: 2g cefazolin and clindamycin 900mg , within 1 hour of skin incision  SPECIMENS: bilateral fallopian tubes  COMPLICATIONS: None  INDICATIONS: Arrest of descent  FINDINGS: No intra-abdominal adhesions were noted. Grossly normal uterus, tubes and ovaries. Clear amniotic fluid, cephalic (OP) female infant, weight 3485gm, APGARs 3/8/9, intact placenta.  PROCEDURE IN DETAIL: The patient was taken to the operating room where anesthesia was administered and normal fetal heart tones were confirmed. She was then prepped and draped in the normal fashion in the dorsal supine position with a leftward tilt.  After a time out was performed, a pfannensteil skin incision was made with the scalpel and carried through to the underlying layer of fascia. The fascia was then incised at the midline and this incision was extended laterally with the mayo scissors. Attention was turned to the superior aspect of the fascial incision which was grasped with the kocher clamps x 2, tented up and the rectus muscles were dissected off with the bovey. In a similar fashion the inferior aspect of the fascial incision was grasped with the kocher clamps, tented up and the rectus muscles dissected off with  the mayo scissors. The rectus muscles were then separated in the midline and the peritoneum was entered bluntly. The bladder blade was inserted and the vesicouterine peritoneum was identified, tented up and entered with the metzenbaum scissors. This incision was extended laterally and the bladder flap was created digitally. The bladder blade was reinserted.  A low transverse hysterotomy was made with the scalpel until the endometrial cavity was breached and the amniotic sac ruptured with the Allis clamp, yielding clear amniotic fluid. This incision was extended bluntly and the infant's head, shoulders and body were delivered atraumatically.The cord was clamped x 2 and cut, and the infant was handed to the awaiting pediatricians.  The placenta was then gradually expressed from the uterus and then the uterus was exteriorized and cleared of all clots and debris. The hysterotomy was repaired with a running suture of 1-0 vicryl. A second imbricating layer of 1-0 vicryl suture was then placed.   The left Fallopian tube was identified by tracing out to the fimbraie, grasped with the Babcock clamps. An avascular midsection of the tube approximately 3-4cm from the cornua was grasped with the babcock clamps and the distal and proximal aspects were ligated with a suture of 2-0 Vicryl, with the intervening portion of tube was transected and removed, via the Metzenbaum scissors. Another suture tie was then placed below both stumps.  Attention was then turned to the right fallopian tube after confirmation by tracing the tube out to the fimbriae. The same procedure was then performed on the right Fallopian tube, with excellent hemostasis was noted from both BTL sites.   The uterus and adnexa were then returned to the  abdomen, and the hysterotomy and all operative sites were reinspected and excellent hemostasis was noted after irrigation and suction of the abdomen with warm saline.  The peritoneum was closed with a running  stitch of 3-0 Vicryl. The fascia was reapproximated with 0 Vicryl in a simple running fashion bilaterally. The subcutaneous layer was then reapproximated with interrupted sutures of 2-0 plain gut, and the skin was then closed with 4-0 vicryl, in a subcuticular fashion.  The patient  tolerated the procedure well. Sponge, lap, needle, and instrument counts were correct x 2. The patient was transferred to the recovery room awake, alert and breathing independently in stable condition.  Gwenevere Abbot, MD Ob Fellow Center for [facility])  Agree with above. I was present and scrubbed for the entire procedure.   Cornelia Copa MD Attending Center for [employer] [facility])

## 2019-02-24 NOTE — Progress Notes (Signed)
[employer] [phone] Reference # [id number] is used for maternal and infant care, medications, safety, postop recovery expectations and reasons to call the nurse are reviewed with patient and FOB, who state understanding information.

## 2019-02-24 NOTE — Discharge Summary (Signed)
Postpartum Discharge Summary     Patient Name: Marcia Long DOB: 11/24/1980 MRN: [id number]  Date of admission: 02/23/2019 Delivering Provider: E. Lopez Long   Date of discharge: 02/24/2019  Admitting diagnosis: OLIGO Intrauterine pregnancy: [redacted]w[redacted]d     Secondary diagnosis:  Active Problems:   HIV (human immunodeficiency virus) risk factors complicating pregnancy   Language barrier   AMA (advanced maternal age) multigravida 35+   Oligohydramnios in third trimester   Chorioamnionitis   Maternal fever during labor   Arrest of descent, delivered, current hospitalization   Encounter for induction of labor   Failed induction of labor   Grand multiparity      Discharge diagnosis: Term Pregnancy Delivered                                                                                                Post partum procedures:none  Augmentation: None  Complications: Intrauterine Inflammation or infection (Chorioamniotis)  Hospital course:  Induction of Labor With Cesarean Section  38 y.o. yo [id number] at [redacted]w[redacted]d was admitted to the hospital 02/23/2019 for induction of labor. Patient had a labor course significant for maternal fever and concern for chorioamnionitis. The patient went for cesarean section due to Arrest of Descent, and delivered a Viable infant,02/24/2019  Membrane Rupture Time/Date: 4:24 AM ,02/24/2019   Details of operation can be found in separate operative Note.  Patient had an uncomplicated postpartum course. She is ambulating, tolerating a regular diet, passing flatus, and urinating well.  Patient is discharged home in stable condition on 02/24/19.                                    Magnesium Sulfate recieved: No BMZ received: No  Physical exam  Vitals:   02/24/19 1635 02/24/19 1640 02/24/19 1652 02/24/19 1750  BP:   122/86 111/77  Pulse: 73 80 66 (!) 54  Resp: 20 14 18 18   Temp:   98.1 F (36.7 C) 97.7 F (36.5 C)  TempSrc:   Oral   SpO2: 92% 91% 97% 97%  Weight:       Height:       General: alert, cooperative and no distress Lochia: appropriate Uterine Fundus: firm Incision: Healing well with no significant drainage, No significant erythema, Dressing is clean, dry, and intact DVT Evaluation: No evidence of DVT seen on physical exam. Labs: Lab Results  Component Value Date   WBC 4.5 02/23/2019   HGB 11.5 (L) 02/23/2019   HCT 34.4 (L) 02/23/2019   MCV 87.1 02/23/2019   PLT 143 (L) 02/23/2019   CMP Latest Ref Rng & Units 11/15/2018  Glucose 65 - 99 mg/dL 78  BUN 7 - 25 mg/dL 8  Creatinine 3.66 - 4.40 mg/dL 3.47  Sodium 425 - 956 mmol/L 138  Potassium 3.5 - 5.3 mmol/L 4.0  Chloride 98 - 110 mmol/L 105  CO2 20 - 32 mmol/L 26  Calcium 8.6 - 10.2 mg/dL 8.9  Total Protein 6.1 - 8.1 g/dL 6.7  Total Bilirubin 0.2 - 1.2 mg/dL 0.3  AST 10 - 30 U/L 12  ALT 6 - 29 U/L 15    Discharge instruction: per After Visit Summary and "Baby and Me Booklet".  After visit meds:    Diet: routine diet  Activity: Advance as tolerated. Pelvic rest for 6 weeks.   Outpatient follow up:6 weeks Follow up Appt: Future Appointments  Date Time Provider Department Center  03/01/2019  1:55 PM Allie Bossierove, Yao Hyppolite C, MD [facility]  05/23/2019  2:30 PM Durwin Noraixon, Gomez CleverlyStephanie N, NP [facility]   Follow up Visit: Please schedule this patient for Postpartum visit in: 6 weeks with the following provider: Any provider For C/S patients schedule nurse incision check in weeks 2 weeks: yes High risk pregnancy complicated by: HIV, AMA, grand multip, language barrier Editor, commissioning(Sango) Delivery mode:  CS Anticipated Birth Control:  BTL done PP PP Procedures needed: Incision check  Schedule Integrated BH visit: no  Newborn Data: Live born female  Birth Weight: 7 lb 10.9 oz (3485 g) APGAR: 3, 8  Newborn Delivery   Birth date/time:  02/24/2019 13:37:00 Delivery type:  C-Section, Low Vertical Trial of labor:  Yes C-section categorization:  Primary     Baby Feeding: Bottle Disposition:home  with mother   02/24/2019 Marcia AbbotNimeka Phillip, MD

## 2019-02-24 NOTE — Progress Notes (Signed)
CSW acknowledged consult "HIV positive; speaks Sango as primary language, states has difficulty affording medications; has not been able to work since [employer]". CSW informed RNCM and requested that RNCM follow up with patient regarding ability to afford medications. CSW will see patient post delivery to discuss Infectious Disease Clinic follow up for infant.   Celso Sickle, LCSW Clinical Social Worker [facility] Cell#: [phone]

## 2019-02-24 NOTE — Care Management Note (Signed)
Case Management Note  Patient Details  Name: Marcia Long MRN: [id number] Date of Birth: September 17, 1981  Subjective/Objective:   38 year old female admitted 02/23/19 for induction d/t oligohydraminos.                  Action/Plan:D/C when medically stable.                 Expected Discharge Plan:  Home/Self Care  In-House Referral:  Clinical Social Work, Research officer, political party  CM Consult  Status of Service:  Completed, signed off  Additional Comments:CM received referral from CSW regarding consult about  medications.  Pt has Medicaid and is not eligible for medication assistance.  Discussed and will continue to follow.  Pt has not missed any medication doses and receives medications through mail order at [facility].  Marcia Long RNC-MNN, BSN 02/24/2019, 10:02 AM

## 2019-02-24 NOTE — Progress Notes (Signed)
Dr. Aneta Mins attempted through [facility] via video machine to obtain [facility] interpreter for patient. Was informed that there is not one currently available, but was able to make an appointment for an interpreter at 10 AM. Patient is alone at present time, no support person with her.     Informed by Dr. Aneta Mins that patient's daughter speaks [place]. This RN was able to get patient to call her daughter to assist in interpreting to patient on how to push, as will resume pushing.

## 2019-02-24 NOTE — Anesthesia Procedure Notes (Signed)
Epidural Patient location during procedure: OB Start time: 02/24/2019 2:48 AM End time: 02/24/2019 2:53 AM  Staffing Anesthesiologist: Beryle Lathe, MD Performed: anesthesiologist   Preanesthetic Checklist Completed: patient identified, pre-op evaluation, timeout performed, IV checked, risks and benefits discussed and monitors and equipment checked  Epidural Patient position: sitting Prep: DuraPrep Patient monitoring: continuous pulse ox and blood pressure Approach: midline Location: L2-L3 Injection technique: LOR saline  Needle:  Needle type: Tuohy  Needle gauge: 17 G Needle length: 9 cm Needle insertion depth: 6 cm Catheter size: 19 Gauge Catheter at skin depth: 12 cm Test dose: negative and Other (1% lidocaine)  Assessment Events: blood not aspirated  Additional Notes Patient identified. Risks including, but not limited to, bleeding, infection, nerve damage, paralysis, inadequate analgesia, blood pressure changes, nausea, vomiting, allergic reaction, postpartum back pain, itching, and headache were discussed. Patient expressed understanding and wished to proceed. Sterile prep and drape, including hand hygiene, mask, and sterile gloves were used. The patient was positioned and the spine was prepped. The skin was anesthetized with lidocaine. No paraesthesia or other complication noted. The patient did not experience any signs of intravascular injection such as tinnitus or metallic taste in mouth, nor signs of intrathecal spread such as rapid motor block. Please see nursing notes for vital signs. The patient tolerated the procedure well.   Leslye Peer, MDReason for block:procedure for pain

## 2019-02-24 NOTE — Lactation Note (Signed)
This note was copied from a baby's chart. Lactation Consultation Note  Patient Name: Boy Vayla Domeier SWHQP'R Date: 02/24/2019   Mom came as breast/formula on admission but due to HIV (+) status, she was advised against BF, baby will be 100% formula fed; lactation services no longer needed.  Maternal Data Formula Feeding for Exclusion: Yes Reason for exclusion: HIV infection  Feeding    Interventions    Lactation Tools Discussed/Used     Consult Status      Abrar Bilton Venetia Constable 02/24/2019, 9:56 PM

## 2019-02-24 NOTE — Progress Notes (Signed)
CTSP for fetal HR decelerations.   Patient has been complete since 0430 but hasn't pushed consistently due to significant language barrier and dense epidural.  Patient started pushing about 41m ago and had late decelerations (mild-moderate) and deep variables. She was also recently dx with IAI, abx not given yet  SVE by me complete/BPD at 0 station/no molding. Pelvis feels adequate and EFW 3500gm.  Category II with baseline in the 160s, moderate variability, no accels, and variable decels to the 90s with pushing. Patient pushes decently but low maternal effort. Daughter is on the phone interpreting for [place] since no Sango interpreter is available. I recommended giving a 1L IVF bolus and starting an amnioinfusion and pt did better with pushing and station did come down some. Somewhat concerning that fetus is lower but I feel she's an OVD candidate if she can get it down lower. Fetus did much better with last two contractions, in terms of the efm. Will come back in 8m to reassess. Dr. Loistine Chance to let me know if decels worsen in the interim.  Cornelia Copa MD Attending Center for [employer] (Faculty Practice) 02/24/2019 Time: 1150am

## 2019-02-24 NOTE — Progress Notes (Signed)
   Marcia Long is a 38 y.o. [id number] at [redacted]w[redacted]d  admitted for induction of labor due to oligohydramnios. Per night team, patient is unable to push and does not seem to understand pushing directions. She speaks Sango, and interpreter has not been available via [employer], despite multiple attempts and despite efforts to schedule an appt with an interpreter.   Subjective: Patient feels cold. Is very non-communicative; wants to pull the covers over her head.   Objective: Vitals:   02/24/19 0901 02/24/19 0931 02/24/19 1001 02/24/19 1031  BP: 110/78 106/89 124/73   Pulse: 66 68 64   Resp: 18 20 18    Temp:    (!) 100.4 F (38 C)  TempSrc:    Axillary  Weight:      Height:       Total I/O In: -  Out: 150 [Urine:150]  FHT:  FHR: 135 bpm, variability: moderate,  accelerations:  Present,  decelerations:  Present now with late decelerations UC:   regular, every 2-3 minutes SVE:   Dilation: 10 Effacement (%): 100 Station: Plus 1 Exam by:: Luna Kitchens, CNM Labs: Lab Results  Component Value Date   WBC 4.5 02/23/2019   HGB 11.5 (L) 02/23/2019   HCT 34.4 (L) 02/23/2019   MCV 87.1 02/23/2019   PLT 143 (L) 02/23/2019    Assessment / Plan: Spoke with patient's daughter via cell phone; explained that baby has been ready to be born for many hours and that we need her help and cooperation to deliver the baby. Explained that we will restart pitocin and consider turning off epidural to assist with pushing efforts.   -after placing IUPC, able to see that patient had a late variable decelerations at 1054; pitocin has been restarted to aid maternal efforts; will continue to monitor closely -antiretrovirals are en route from pharmacy -Sango interpreter not available at this time; appointment would be available at 4 pm, -now with maternal fever of 100.4; stat amp and gent ordered   Labor: has been 10 cm; will attempt Vaginal delivery; LD attending aware Fetal Wellbeing:  Category II Pain  Control:  Epidural Anticipated MOD:  NSVD  Charlesetta Garibaldi  02/24/2019, 10:50 AM

## 2019-02-24 NOTE — Progress Notes (Signed)
(  Late entry for care of patient by this RN during time of 0720-1530 in Labor & Delivery; OR; PACU) : This RN, patient's MD & CNM attempted multiple times to reach an interpreter who spoke [place] language via the [employer] video & [employer] interpre[employer] via audio on video. Every attempt failed, as no Sango interpreter was available. Also made multiple appointments for [facility] interpreter via the same service. Once appointment time arrived, was told by [facility] no [facility] interpreter available.    Patient was alone with no support person. Patient was able to call her daughter, who speaks [place], & daughter interpreted for patient while phone on speaker mode. Daughter was also able to interpret via speaker mode on patient's phone while patient in OR & PACU; each for a short time.     Patient was able to obey commands, answer questions, ask questions, & verbalized understanding of each step of plan of care throughout the day, including her surgery and post anesthesia care.

## 2019-02-25 ENCOUNTER — Encounter (HOSPITAL_COMMUNITY): Payer: Self-pay | Admitting: Obstetrics and Gynecology

## 2019-02-25 LAB — CBC
HCT: 29.5 % — ABNORMAL LOW (ref 36.0–46.0)
Hemoglobin: 9.8 g/dL — ABNORMAL LOW (ref 12.0–15.0)
MCH: 28.9 pg (ref 26.0–34.0)
MCHC: 33.2 g/dL (ref 30.0–36.0)
MCV: 87 fL (ref 80.0–100.0)
Platelets: 151 10*3/uL (ref 150–400)
RBC: 3.39 MIL/uL — ABNORMAL LOW (ref 3.87–5.11)
RDW: 13.8 % (ref 11.5–15.5)
WBC: 18.1 10*3/uL — ABNORMAL HIGH (ref 4.0–10.5)
nRBC: 0 % (ref 0.0–0.2)

## 2019-02-25 LAB — CREATININE, SERUM
Creatinine, Ser: 0.95 mg/dL (ref 0.44–1.00)
GFR calc Af Amer: >60 mL/min (ref >60)
GFR calc non Af Amer: >60 mL/min (ref >60)

## 2019-02-25 MED ORDER — PROMETHAZINE HCL 25 MG PO TABS: 12.5000 mg | ORAL_TABLET | Freq: Four times a day (QID) | ORAL | Status: DC | PRN

## 2019-02-25 MED ORDER — LACTATED RINGERS IV BOLUS
1000.0000 mL | Freq: Once | INTRAVENOUS | Status: AC
  Administered 2019-02-25: 1000 mL via INTRAVENOUS

## 2019-02-25 MED ORDER — FERROUS SULFATE 325 (65 FE) MG PO TABS
325.0000 mg | ORAL_TABLET | Freq: Two times a day (BID) | ORAL | Status: DC
  Administered 2019-02-25 – 2019-02-27 (×4): 325 mg via ORAL
  Filled 2019-02-25 (×4): qty 1

## 2019-02-25 NOTE — Progress Notes (Signed)
Subjective: *Telephone interpreter used for visit*  Postpartum Day 1: Cesarean Delivery Patient reports some nausea/vomiting overnight as well as some lower abdominal pain. Pressure dressing still in place.  Per RN, only tried to get up once around 0600 with lots of nausea/vomiting. Foley still in place and little, dark UOP since shift change. Hasn't tried eating/drinking much since that time.   Objective: Vital signs in last 24 hours: Temp:  [97.7 F (36.5 C)-98.6 F (37 C)] 97.9 F (36.6 C) (05/02 0835) Pulse Rate:  [48-80] 50 (05/02 0835) Resp:  [12-24] 16 (05/02 0835) BP: (94-138)/(59-86) 94/59 (05/02 0835) SpO2:  [91 %-100 %] 100 % (05/02 0835)  Physical Exam:  General: alert, tired-appearing, NAD Lochia: appropriate Uterine Fundus: firm Incision: pressure dressing still inplace DVT Evaluation: no significant LE edema  Recent Labs    02/23/19 1036 02/25/19 0510  HGB 11.5* 9.8*  HCT 34.4* 29.5*    Assessment/Plan: Status post Cesarean section. Doing well postoperatively.  Continue current care. HgB stable but will start ferrous sulfate given Hct <30 1L LR bolus for lower UOP and lower Bps Phenergan prn N/V Encouraged nurse to try to get up later today and then remove foley Okay to leave pressure dressing in place until tomorrow   Tamera Stands, DO  02/25/2019, 12:56 PM

## 2019-02-25 NOTE — Progress Notes (Signed)
I have attempted twice to reach [facility] interpreter. No [place] interpreter available with [employer] at this time. I was able to make appointment at 11:45 through [facility] interpreter Reference #AC- [id number]. Will continue to try.

## 2019-02-25 NOTE — Progress Notes (Signed)
CSW met with MOB to discuss consult for HIV exposed newborn. MOB was accompanied by nurse that had an interpreter on the phone and FOB. CSW used interpreter to complete assessment. CSW asked FOB to leave during assessment with MOB's permission, FOB left voluntarily. CSW introduced self and explained reason for consult. CSW and MOB discussed MOB's HIV diagnosis. MOB reported that she found out during one of her pregnancies and was initially worried about diagnosis but after learning more about it and being treated she was okay. MOB confirmed that she wants the infant to do Pediatric Infectious Disease Follow up at [facility]. MOB has an appointment scheduled Mar 10, 2019 at 9:30 AM, MOB provided with this information and verbalized understanding.   Ciel Chervenak, LCSW Clinical Social Worker [facility] Cell#: [phone]  

## 2019-02-25 NOTE — Progress Notes (Signed)
Sango interpreter # [phone] used for teaching and communication  for one hour. Dr Jena Gauss and Dr Earlene Plater at bedside also.  Will continue to monitor pt.

## 2019-02-25 NOTE — Progress Notes (Signed)
RN attempted multiple times to obtain a Sango interpreter to explain medications, newborn care/feeding, orthostatic vitals and foley catheter removal via [employer] but no Sango interpreter available at this time. RN inquired about when one would be available, [employer] representative stated "That's a very rare language so I'm not sure when one will be available. You can try in a few hours". RN will attempt again later.   Patrica Duel, RN 02/25/2019 4:03 AM

## 2019-02-26 LAB — HIV-1 RNA QUANT-NO REFLEX-BLD
HIV 1 RNA Quant: <20 copies/mL
HIV 1 RNA Quant: 60 copies/mL
HIV-1 RNA Quant, Log: <1.3 Log copies/mL
LOG10 HIV-1 RNA: 1.778 log10copy/mL

## 2019-02-26 NOTE — Progress Notes (Signed)
Subjective: *Telephone interpreter unable at time of visit, so visit and exam conducted with limited English and per RN discussion with patient with use of interpreter earlier in the day*  Postpartum Day 1: Cesarean Delivery Nausea and vomiting improved, has been able to eat more. No lightheadedness. Still with some pain. Planning to get up and shower soon.   Objective: Vital signs in last 24 hours: Temp:  [97.7 F (36.5 C)-98.6 F (37 C)] 98.6 F (37 C) (05/03 1505) Pulse Rate:  [48-55] 48 (05/03 1505) Resp:  [16-18] 16 (05/03 1505) BP: (89-125)/(55-63) 107/62 (05/03 1505) SpO2:  [97 %-100 %] 100 % (05/03 0513)  Physical Exam:  General: alert, sitting up in bed, NAD Lochia: appropriate Uterine Fundus: firm Incision: pressure dressing still inplace DVT Evaluation: no significant LE edema  Recent Labs    02/25/19 0510  HGB 9.8*  HCT 29.5*    Assessment/Plan: Status post Cesarean section. Doing well postoperatively.  Continue current care. Remove pressure dressing during shower Likely discharge home tomorrow   Tamera Stands, DO  02/26/2019, 6:34 PM

## 2019-02-27 ENCOUNTER — Other Ambulatory Visit: Payer: Self-pay

## 2019-02-27 ENCOUNTER — Encounter: Payer: Self-pay | Admitting: Infectious Diseases

## 2019-02-27 MED ORDER — OXYCODONE-ACETAMINOPHEN 5-325 MG PO TABS: 1.0000 | ORAL_TABLET | ORAL | 0 refills | Status: DC | PRN

## 2019-02-27 MED ORDER — EMTRICITABINE-TENOFOVIR AF 200-25 MG PO TABS: 1.0000 | ORAL_TABLET | Freq: Every day | ORAL | 11 refills | Status: DC

## 2019-02-27 MED ORDER — DOLUTEGRAVIR SODIUM 50 MG PO TABS: 50.0000 mg | ORAL_TABLET | Freq: Every day | ORAL | 11 refills | Status: DC

## 2019-02-27 MED ORDER — IBUPROFEN 600 MG PO TABS: 600.0000 mg | ORAL_TABLET | Freq: Four times a day (QID) | ORAL | 0 refills | Status: DC | PRN

## 2019-02-27 MED FILL — DESCOVY 200-25 MG TABS: 200-25 | 30 days supply | Qty: 30 | Fill #0 | Status: TO

## 2019-02-27 MED FILL — TIVICAY 50 MG TABLET: 50 | 30 days supply | Qty: 30 | Fill #0 | Status: TO

## 2019-02-27 MED FILL — OXYCODONE W/APAP 5/325 TAB: 5-325 | 5 days supply | Qty: 20 | Fill #0

## 2019-02-27 MED FILL — IBUPROFEN 600 MG TABLET: 600 | 7 days supply | Qty: 30 | Fill #0

## 2019-02-27 NOTE — Care Management (Signed)
Case Management spoke to Alinda Money - pharmacist  in the [facility] and patient's medications for discharge will be ready by noon tomorrow on Tuesday 02/28/19 for patient to go home with prior to discharge.

## 2019-02-27 NOTE — Progress Notes (Signed)
MOB was updated on plan of care using interpreter [phone]. Audiology and pediatrician was in the room assessing baby and mother received updates from them as well. Outpatient audiology appointment made and MD follow up appointment made. Mother is requesting DEPO for birth control. She complains of slight discomfort in her incision, but otherwise says she feels fine. Denies headache and blurred vision. Mother is aware all meds will be sent home tomorrow with discharge. Will continue to monitor. Royston Cowper, RN

## 2019-02-27 NOTE — Assessment & Plan Note (Signed)
Doing well on Tivicay Truvada.  We will plan to switch either back to [facility] or once daily Biktarvy after delivery.  She can follow-up here in 3 months postpartum.  Asked her to continue taking her Tivicay and Truvada.  If she finds that it would be easier to resume single tablet regimen before our office visit together we can certainly change earlier.

## 2019-02-27 NOTE — Assessment & Plan Note (Signed)
Marcia Long is doing very well on her medication regimen.  She is taking her medications as asked.  She has had serial undetectable viral loads.  We will check 1 more today prior to her upcoming delivery.  Discussed upcoming appointment with MFM clinic.Discussed again recommendation to formula feed. If she does desire to breastfeed still will discuss harm reduction practices further and infant antiretroviral prophylaxis throughout breastfeeding. Presumed risk for acquisition is statistically very low 0.3 - 0.6% transition over 12 months of nursing in the setting of maternal viral suppression and infant ARV proph.

## 2019-02-27 NOTE — Progress Notes (Signed)
Interpreter appointment request for today at 11 am confirmation number: [id number]

## 2019-02-27 NOTE — Progress Notes (Signed)
Interpreter requested for assessment and there was not one available at this time. An appointment request was made for 11 am with an interpreter. Patient said she can understand minimal english but needed an interpreter to answer most questions. Patient aware of appointment time. Breakfast was ordered. Will continue to monitor. Royston Cowper, RN

## 2019-02-27 NOTE — Progress Notes (Signed)
Subjective: Postpartum Day 3 : Cesarean Delivery Patient reports incisional pain, tolerating PO, + flatus and no problems voiding.    Objective: Vital signs in last 24 hours: Temp:  [98.2 F (36.8 C)-98.6 F (37 C)] 98.2 F (36.8 C) (05/04 0511) Pulse Rate:  [45-48] 45 (05/04 0511) Resp:  [16-18] 18 (05/04 0511) BP: (99-108)/(54-67) 99/54 (05/04 0511) SpO2:  [100 %] 100 % (05/04 0511)  Physical Exam:  General: alert Lochia: appropriate Uterine Fundus: firm Dressing: clean, dry, and intact DVT Evaluation: No evidence of DVT seen on physical exam.  Recent Labs    02/25/19 0510  HGB 9.8*  HCT 29.5*    Assessment/Plan: Status post Cesarean section. Doing well postoperatively.  She will go home tomorrow when [facility] will have a 30 day supply of her HIV meds ready.  Supriya Beaston C Sharrod Achille 02/27/2019, 10:07 AM

## 2019-02-28 NOTE — Discharge Instructions (Signed)

## 2019-02-28 NOTE — Progress Notes (Signed)
Language line interpreter used. Unable to get ID number. Went over all appointments for mom and baby, discharge instructions for mom and baby, medications for mom and baby including how to give to baby, and when to call the doctors.

## 2019-03-01 ENCOUNTER — Encounter: Payer: Medicaid Other | Admitting: Obstetrics & Gynecology

## 2019-03-03 ENCOUNTER — Inpatient Hospital Stay (HOSPITAL_COMMUNITY)
Admission: AD | Admit: 2019-03-03 | Discharge: 2019-03-03 | Disposition: A | Discharge status: Home / Self Care | Payer: Medicaid Other | Attending: Obstetrics & Gynecology | Admitting: Obstetrics & Gynecology

## 2019-03-03 ENCOUNTER — Encounter (HOSPITAL_COMMUNITY): Payer: Self-pay | Admitting: *Deleted

## 2019-03-03 ENCOUNTER — Other Ambulatory Visit: Payer: Self-pay

## 2019-03-03 DIAGNOSIS — R109 Unspecified abdominal pain: Secondary | ICD-10-CM | POA: Diagnosis not present

## 2019-03-03 DIAGNOSIS — L7682 Other postprocedural complications of skin and subcutaneous tissue: Secondary | ICD-10-CM | POA: Diagnosis not present

## 2019-03-03 DIAGNOSIS — Z98891 History of uterine scar from previous surgery: Secondary | ICD-10-CM

## 2019-03-03 NOTE — MAU Provider Note (Signed)
History     CSN: [id number]  Arrival date and time: 03/03/19 1115   First Provider Initiated Contact with Patient 03/03/19 1156      Chief Complaint  Patient presents with  . Abdominal Pain   Ms. Marcia Long is a 38 y.o. K2H0623 at Unknown who presents to MAU for an incision check. Pt reports she had a C/S 02/24/2019 and was told to return to the hospital in 1 week for an incision check. Pt reports mild pain today at incision site. Pt denies other symptoms.  Of note, translator for patient's primary language unavailable through language line. Pt had to call a friend to translate for her over the phone for today's appointment.   OB History    Gravida  6   Para  6   Term  6   Preterm  0   AB  0   Living  6     SAB  0   TAB  0   Ectopic  0   Multiple  0   Live Births  6           Past Medical History:  Diagnosis Date  . HIV (human immunodeficiency virus infection) ([place])   . HIV (human immunodeficiency virus infection) ([place]) 11/30/2018   Dx 2006 HLA neg Quantiferon neg Hep B sAg neg      Past Surgical History:  Procedure Laterality Date  . CESAREAN SECTION N/A 02/24/2019   Procedure: CESAREAN SECTION;  Surgeon: Aletha Halim, MD;  Location: [facility];  Service: Obstetrics;  Laterality: N/A;  . NO PAST SURGERIES      No family history on file.  Social History   Tobacco Use  . Smoking status: Never Smoker  . Smokeless tobacco: Never Used  Substance Use Topics  . Alcohol use: Not Currently  . Drug use: Not Currently    Allergies: No Known Allergies  Medications Prior to Admission  Medication Sig Dispense Refill Last Dose  . dolutegravir (TIVICAY) 50 MG tablet Take 1 tablet (50 mg total) by mouth daily. 30 tablet 11   . Elastic Bandages & Supports (COMFORT FIT MATERNITY SUPP MED) MISC Wear daily when ambulating 1 each 0 Taking  . emtricitabine-tenofovir AF (DESCOVY) 200-25 MG tablet Take 1 tablet by mouth daily. 30 tablet 11   . ibuprofen  (ADVIL) 600 MG tablet Take 1 tablet (600 mg total) by mouth every 6 (six) hours as needed for headache, mild pain, moderate pain or cramping. 30 tablet 0   . oxyCODONE-acetaminophen (PERCOCET/ROXICET) 5-325 MG tablet Take 1 tablet by mouth every 4 (four) hours as needed for moderate pain. 20 tablet 0   . Prenat-FeCbn-FeAsp-Meth-FA-DHA (PRENATE MINI) 18-0.6-0.4-350 MG CAPS Take 1 tablet by mouth daily. 30 capsule 12 02/22/2019 at Unknown time    Review of Systems  Constitutional: Negative for chills, diaphoresis, fatigue and fever.  Respiratory: Negative for shortness of breath.   Cardiovascular: Negative for chest pain.  Gastrointestinal: Positive for abdominal pain. Negative for constipation, diarrhea, nausea and vomiting.  Genitourinary: Negative for dysuria, flank pain, frequency, pelvic pain and urgency.  Neurological: Negative for dizziness, weakness, light-headedness and headaches.   Physical Exam   Blood pressure 118/65, pulse (!) 44, temperature 98.1 F (36.7 C), temperature source Oral, resp. rate 16, unknown if currently breastfeeding.  Patient Vitals for the past 24 hrs:  BP Temp Temp src Pulse Resp  03/03/19 1144 118/65 98.1 F (36.7 C) Oral (!) 44 16    Physical Exam  Constitutional:  She is oriented to person, place, and time. She appears well-developed and well-nourished. No distress.  HENT:  Head: Normocephalic and atraumatic.  Respiratory: Effort normal.  GI: Soft. She exhibits no distension and no mass. There is abdominal tenderness. There is no rebound and no guarding.  C/S incision free of redness, drainage, swelling, bleeding. No signs of dehiscence or infection. Mildly tender on exam. Steri-strips still in place.  Neurological: She is alert and oriented to person, place, and time.  Skin: Skin is warm and dry. She is not diaphoretic.  Psychiatric: She has a normal mood and affect. Her behavior is normal. Judgment and thought content normal.   No results found for  this or any previous visit (from the past 24 hour(s)).  [place] Mfm Fetal Bpp Wo Non Stress  Result Date: 02/22/2019 ----------------------------------------------------------------------  OBSTETRICS REPORT                       (Signed Final 02/22/2019 01:45 pm) ---------------------------------------------------------------------- Patient Info  ID #:       [id number]                          D.O.B.:  10-27-1980 (38 yrs)  Name:       Marcia Long                  Visit Date: 02/22/2019 12:51 pm ---------------------------------------------------------------------- Performed By  Performed By:     Jeanene Erb BS,      Ref. Address:     Faculty                    RDMS  Attending:        Sander Nephew      [facility]                    MD                                       [facility]  Referred By:      Mora Bellman MD ---------------------------------------------------------------------- Orders   #  Description                          Code         Ordered By   1  [place] MFM OB FOLLOW UP                  73578.97     Sander Nephew   2  [place] MFM FETAL BPP WO NON              84784.12     CORENTHIAN      STRESS  BOOKER  ----------------------------------------------------------------------   #  Order #                    Accession #                 Episode #   1  [id number]                  [id number]                  [id number]   2  [id number]                  [id number]                  [id number]  ---------------------------------------------------------------------- Indications   HIV affecting pregnancy, third trimester       O98.713   Late to prenatal care, third trimester         O09.33   [redacted] weeks gestation of pregnancy                Z3A.33   Advanced maternal age multigravida 31+,        O62.523   third trimester   Abnormal biochemical screen (quad) for         O28.9    Trisomy 21 (1:193) (low risk NIPs)  ---------------------------------------------------------------------- Vital Signs  Weight (lb): 176                               Height:        5'5"  BMI:         29.28 ---------------------------------------------------------------------- Fetal Evaluation  Num Of Fetuses:         1  Fetal Heart Rate(bpm):  144  Cardiac Activity:       Observed  Presentation:           Cephalic  Placenta:               Anterior  P. Cord Insertion:      Previously Visualized  Amniotic Fluid  AFI FV:      Subjectively decreased  AFI Sum(cm)     %Tile       Largest Pocket(cm)  3.79            < 3         2.19  RUQ(cm)                     LUQ(cm)  2.19                        1.6 ---------------------------------------------------------------------- Biophysical Evaluation  Amniotic F.V:   Pocket => 2 cm two         F. Tone:        Observed                  planes  F. Movement:    Observed                   Score:          8/8  F. Breathing:   Observed ---------------------------------------------------------------------- Biometry  BPD:      94.8  mm     G. Age:  38w 4d         74  %    CI:        78.02   %  70 - 86                                                          FL/HC:      22.6   %    20.6 - 23.4  HC:      339.6  mm     G. Age:  39w 0d         39  %    HC/AC:      0.96        0.87 - 1.06  AC:       353   mm     G. Age:  39w 1d         77  %    FL/BPD:     81.1   %    71 - 87  FL:       76.9  mm     G. Age:  39w 2d         65  %    FL/AC:      21.8   %    20 - 24  Est. FW:    3699  gm      8 lb 2 oz     87  % ---------------------------------------------------------------------- OB History  Gravidity:    6         Term:   5        Prem:   0        SAB:   0  TOP:          0       Ectopic:  0        Living: 5 ---------------------------------------------------------------------- Gestational Age  LMP:           34w 6d        Date:  06/23/18                 EDD:   03/30/19  U/S Today:     39w  0d                                        EDD:   03/01/19  Best:          39w 0d     Det. By:  U/S  (11/17/18)          EDD:   03/01/19 ---------------------------------------------------------------------- Anatomy  Cranium:               Appears normal         Aortic Arch:            Previously seen  Cavum:                 Previously seen        Ductal Arch:            Previously seen  Ventricles:            Previously seen        Diaphragm:              Previously seen  Choroid Plexus:        Previously seen  Stomach:                Not well visualized  Cerebellum:            Previously seen        Abdomen:                Previously seen  Posterior Fossa:       Previously seen        Abdominal Wall:         Previously seen  Nuchal Fold:           Not applicable (>28    Cord Vessels:           Previously seen                         wks GA)  Face:                  Orbits and profile     Kidneys:                Appear normal                         previously seen  Lips:                  Previously seen        Bladder:                Appears normal  Thoracic:              Appears normal         Spine:                  Previously seen  Heart:                 Appears normal         Upper Extremities:      Previously seen                         (4CH, axis, and                         situs)  RVOT:                  Previously seen        Lower Extremities:      Previously seen  LVOT:                  Previously seen  Other:  Fetus appears to be a female prev seen. Heels and 5th digit visualized          previously. Technically difficult due to maternal habitus and fetal          position and movement. ---------------------------------------------------------------------- Cervix Uterus Adnexa  Cervix  Not visualized (advanced GA >24wks) ---------------------------------------------------------------------- Impression  Normal interval growth.  Oligohydramios- fluid filled stomach not visualized but seen  previously.   Biophysical profile 8/8 ---------------------------------------------------------------------- Recommendations  To L&D for delivery give oligohydramnios at term  Discussed with Dr. Si Raider ----------------------------------------------------------------------               Sander Nephew, MD Electronically Signed Final Report   02/22/2019 01:45 pm ----------------------------------------------------------------------  [place] Mfm Ob Follow Up  Result Date: 02/22/2019 ----------------------------------------------------------------------  OBSTETRICS REPORT                       (  Signed Final 02/22/2019 01:45 pm) ---------------------------------------------------------------------- Patient Info  ID #:       [id number]                          D.O.B.:  January 22, 1981 (38 yrs)  Name:       Marcia Long                  Visit Date: 02/22/2019 12:51 pm ---------------------------------------------------------------------- Performed By  Performed By:     Jeanene Erb BS,      Ref. Address:     Faculty                    RDMS  Attending:        Sander Nephew      Location[facility]                    MD                                       [facility]etal Care  Referred By:      Mora Bellman MD ---------------------------------------------------------------------- Orders   #  Description                          Code         Ordered By   1  [place] MFM OB FOLLOW UP                  33295.18     Sander Nephew   2  [place] MFM FETAL BPP WO NON              76819.01     CORENTHIAN      STRESS                                            BOOKER  ----------------------------------------------------------------------   #  Order #                    Accession #                 Episode #   1  [id number]                  [id number]                  [id number]   2  [id number]                  [id number]                  [id number]   ---------------------------------------------------------------------- Indications   HIV affecting pregnancy, third trimester  J24.268   Late to prenatal care, third trimester         O09.33   [redacted] weeks gestation of pregnancy                Z3A.46   Advanced maternal age multigravida 79+,        O12.523   third trimester   Abnormal biochemical screen (quad) for         O28.9   Trisomy 21 (1:193) (low risk NIPs)  ---------------------------------------------------------------------- Vital Signs  Weight (lb): 176                               Height:        5'5"  BMI:         29.28 ---------------------------------------------------------------------- Fetal Evaluation  Num Of Fetuses:         1  Fetal Heart Rate(bpm):  144  Cardiac Activity:       Observed  Presentation:           Cephalic  Placenta:               Anterior  P. Cord Insertion:      Previously Visualized  Amniotic Fluid  AFI FV:      Subjectively decreased  AFI Sum(cm)     %Tile       Largest Pocket(cm)  3.79            < 3         2.19  RUQ(cm)                     LUQ(cm)  2.19                        1.6 ---------------------------------------------------------------------- Biophysical Evaluation  Amniotic F.V:   Pocket => 2 cm two         F. Tone:        Observed                  planes  F. Movement:    Observed                   Score:          8/8  F. Breathing:   Observed ---------------------------------------------------------------------- Biometry  BPD:      94.8  mm     G. Age:  38w 4d         74  %    CI:        78.02   %    70 - 86                                                          FL/HC:      22.6   %    20.6 - 23.4  HC:      339.6  mm     G. Age:  39w 0d         39  %    HC/AC:      0.96        0.87 - 1.06  AC:       353   mm  G. Age:  39w 1d         7  %    FL/BPD:     81.1   %    71 - 87  FL:       76.9  mm     G. Age:  39w 2d         65  %    FL/AC:      21.8   %    20 - 24  Est. FW:    3699  gm      8 lb 2 oz     87  %  ---------------------------------------------------------------------- OB History  Gravidity:    6         Term:   5        Prem:   0        SAB:   0  TOP:          0       Ectopic:  0        Living: 5 ---------------------------------------------------------------------- Gestational Age  LMP:           34w 6d        Date:  06/23/18                 EDD:   03/30/19  U/S Today:     39w 0d                                        EDD:   03/01/19  Best:          39w 0d     Det. By:  U/S  (11/17/18)          EDD:   03/01/19 ---------------------------------------------------------------------- Anatomy  Cranium:               Appears normal         Aortic Arch:            Previously seen  Cavum:                 Previously seen        Ductal Arch:            Previously seen  Ventricles:            Previously seen        Diaphragm:              Previously seen  Choroid Plexus:        Previously seen        Stomach:                Not well visualized  Cerebellum:            Previously seen        Abdomen:                Previously seen  Posterior Fossa:       Previously seen        Abdominal Wall:         Previously seen  Nuchal Fold:           Not applicable (>33    Cord Vessels:           Previously seen  wks GA)  Face:                  Orbits and profile     Kidneys:                Appear normal                         previously seen  Lips:                  Previously seen        Bladder:                Appears normal  Thoracic:              Appears normal         Spine:                  Previously seen  Heart:                 Appears normal         Upper Extremities:      Previously seen                         (4CH, axis, and                         situs)  RVOT:                  Previously seen        Lower Extremities:      Previously seen  LVOT:                  Previously seen  Other:  Fetus appears to be a female prev seen. Heels and 5th digit visualized          previously. Technically  difficult due to maternal habitus and fetal          position and movement. ---------------------------------------------------------------------- Cervix Uterus Adnexa  Cervix  Not visualized (advanced GA >24wks) ---------------------------------------------------------------------- Impression  Normal interval growth.  Oligohydramios- fluid filled stomach not visualized but seen  previously.  Biophysical profile 8/8 ---------------------------------------------------------------------- Recommendations  To L&D for delivery give oligohydramnios at term  Discussed with Dr. Si Raider ----------------------------------------------------------------------               Sander Nephew, MD Electronically Signed Final Report   02/22/2019 01:45 pm ----------------------------------------------------------------------   MAU Course  Procedures  MDM -incision check -s/p C/S 1wk -incision free from signs of infection or dehiscence, mildly tender on exam, steri-strips still in place -discussed pt appt at clinic -pt discharged to home in stable condition  Assessment and Plan   1. Status post C-section   2. Pain at surgical incision    Allergies as of 03/03/2019   No Known Allergies     Medication List    TAKE these medications   Comfort Fit Maternity Supp Med Misc Wear daily when ambulating   dolutegravir 50 MG tablet Commonly known as:  Tivicay Take 1 tablet (50 mg total) by mouth daily.   emtricitabine-tenofovir AF 200-25 MG tablet Commonly known as:  DESCOVY Take 1 tablet by mouth daily.   ibuprofen 600 MG tablet Commonly known as:  ADVIL Take 1 tablet (600 mg total) by mouth every 6 (six) hours as needed for headache, mild pain, moderate pain or cramping.  oxyCODONE-acetaminophen 5-325 MG tablet Commonly known as:  PERCOCET/ROXICET Take 1 tablet by mouth every 4 (four) hours as needed for moderate pain.   Prenate Mini 18-0.6-0.4-350 MG Caps Take 1 tablet by mouth daily.       -discussed expectations for healing and pain postcesarean -reminded pt to use pain management medications prescribed by discharging hospital provider PRN -discussed s/sx of infection/fever/return MAU precautions -discussed next appt 03/10/2019 @10am  @ [facility] -pt discharged to home in stable condition  Elmyra Ricks E Nugent 03/03/2019, 12:21 PM

## 2019-03-03 NOTE — MAU Note (Signed)
PT presents to MAU, does not speak any english, interpreter not available.

## 2019-03-03 NOTE — MAU Note (Signed)
After speaking with provider pt has an appointment for next week for incision check

## 2019-03-03 NOTE — Discharge Instructions (Signed)

## 2019-03-06 ENCOUNTER — Other Ambulatory Visit: Payer: Self-pay

## 2019-03-06 ENCOUNTER — Inpatient Hospital Stay (HOSPITAL_COMMUNITY)
Admission: AD | Admit: 2019-03-06 | Discharge: 2019-03-06 | Disposition: A | Discharge status: Home / Self Care | Payer: Medicaid Other | Attending: Obstetrics and Gynecology | Admitting: Obstetrics and Gynecology

## 2019-03-06 DIAGNOSIS — O8600 Infection of obstetric surgical wound, unspecified: Secondary | ICD-10-CM | POA: Insufficient documentation

## 2019-03-06 DIAGNOSIS — R109 Unspecified abdominal pain: Secondary | ICD-10-CM | POA: Insufficient documentation

## 2019-03-06 MED ORDER — CEPHALEXIN 500 MG PO CAPS: 500.0000 mg | ORAL_CAPSULE | Freq: Four times a day (QID) | ORAL | 0 refills | Status: AC

## 2019-03-06 NOTE — Discharge Instructions (Signed)

## 2019-03-06 NOTE — MAU Provider Note (Signed)
History     CSN: [id number]  Arrival date and time: 03/06/19 1255   First Provider Initiated Contact with Patient 03/06/19 1328      Chief Complaint  Patient presents with  . Abdominal Pain   Marcia Long is a 38 y.o. [id number] at postpartum who presents to MAU for abdominal pain. Pt is s/p uncomplicated C/S & BTL on 02/24/2019 for arrest of descent. Pt was seen in MAU on Friday 03/03/2019 because she was told to follow-up for a 1-week incision check and reports she was told to come back to the hospital for this. At that time, pt's incision was normal in appearance, without s/sx of infection. Today, pt reports swelling of the incision site, accompanied by several hours of bleeding from two locations along the incision beginning early yesterday evening and ending this morning. Pt denies any bleeding from the incision site at this time. Pt reports she has been using Desitin cream on her C/S scar to promote healing.  Pt denies N/V, abdominal pain, constipation, diarrhea, or urinary problems. Pt denies fever, chills, fatigue, sweating or changes in appetite. Pt denies SOB or chest pain. Pt denies dizziness, HA, light-headedness, weakness.  Unable to use hospital interpreter d/t lack of staff at agency. Pt called interpreter from [facility]. Interpreter used for entire visit.   OB History    Gravida  6   Para  6   Term  6   Preterm  0   AB  0   Living  6     SAB  0   TAB  0   Ectopic  0   Multiple  0   Live Births  6           Past Medical History:  Diagnosis Date  . HIV (human immunodeficiency virus infection) ([place])   . HIV (human immunodeficiency virus infection) ([place]) 11/30/2018   Dx 2006 HLA neg Quantiferon neg Hep B sAg neg      Past Surgical History:  Procedure Laterality Date  . CESAREAN SECTION N/A 02/24/2019   Procedure: CESAREAN SECTION;  Surgeon: Aletha Halim, MD;  Location: [facility] LD ORS;  Service: Obstetrics;  Laterality: N/A;  . NO PAST SURGERIES       No family history on file.  Social History   Tobacco Use  . Smoking status: Never Smoker  . Smokeless tobacco: Never Used  Substance Use Topics  . Alcohol use: Not Currently  . Drug use: Not Currently    Allergies: No Known Allergies  No medications prior to admission.    Review of Systems  Constitutional: Negative for chills, diaphoresis, fatigue and fever.  Respiratory: Negative for shortness of breath.   Cardiovascular: Negative for chest pain.  Gastrointestinal: Negative for abdominal pain, constipation, diarrhea, nausea and vomiting.  Genitourinary: Positive for pelvic pain. Negative for dysuria, flank pain, frequency and urgency.  Neurological: Negative for dizziness, weakness, light-headedness and headaches.   Physical Exam   Blood pressure 123/71, pulse (!) 44, temperature 98.1 F (36.7 C), temperature source Oral, resp. rate 16, weight 71.8 kg, SpO2 100 %, unknown if currently breastfeeding.  Patient Vitals for the past 24 hrs:  BP Temp Temp src Pulse Resp SpO2 Weight  03/06/19 1438 123/71 98.1 F (36.7 C) Oral (!) 44 16 - -  03/06/19 1318 119/63 98.4 F (36.9 C) Oral (!) 51 16 100 % 71.8 kg  03/06/19 1316 - - - - - 99 % -    Physical Exam  Constitutional: She is  oriented to person, place, and time. She appears well-developed and well-nourished. No distress.  HENT:  Head: Normocephalic and atraumatic.  Respiratory: Effort normal.  GI: Soft. She exhibits no distension and no mass. There is abdominal tenderness. There is no rebound and no guarding.  C/S incision swollen along entire length compared with visit on Friday 03/03/2019. Two areas noted with indurated masses - first area about 1cm x 2cm along far right edge, 2nd mass noted under area of serosanguinous drainage (see below) approximately 3x3cm. Masses palpate superficial today. No bleeding noted, small area of serosanguinous drainage noted along left side of incision, about 3-4cm from left edge.   Neurological: She is alert and oriented to person, place, and time.  Skin: Skin is warm and dry. She is not diaphoretic.  Psychiatric: She has a normal mood and affect. Her behavior is normal. Judgment and thought content normal.   No results found for this or any previous visit (from the past 24 hour(s)).  [place] Mfm Fetal Bpp Wo Non Stress  Result Date: 02/22/2019 ----------------------------------------------------------------------  OBSTETRICS REPORT                       (Signed Final 02/22/2019 01:45 pm) ---------------------------------------------------------------------- Patient Info  ID #:       [id number]                          D.O.B.:  11/15/1980 (38 yrs)  Name:       Marcia Long                  Visit Date: 02/22/2019 12:51 pm ---------------------------------------------------------------------- Performed By  Performed By:     Jeanene Erb BS,      Ref. Address:     Faculty                    RDMS  Attending:        Sander Nephew      [facility]                    MD                                       Fetal Care  Referred By:      Vickii Chafe                    CONSTANT MD ---------------------------------------------------------------------- Orders   #  Description                          Code         Ordered By   1  [place] MFM OB FOLLOW UP                  09735.32     Sander Nephew   2  [place] MFM FETAL BPP WO NON  70488.89     [facility]      STRESS                                            BOOKER  ----------------------------------------------------------------------   #  Order #                    Accession #                 Episode #   1  [id number]                  [id number]                  [id number]   2  [id number]                  [id number]                  [id number]  ---------------------------------------------------------------------- Indications   HIV affecting pregnancy, third trimester        O98.713   Late to prenatal care, third trimester         O09.33   [redacted] weeks gestation of pregnancy                Z3A.35   Advanced maternal age multigravida 25+,        O37.523   third trimester   Abnormal biochemical screen (quad) for         O28.9   Trisomy 21 (1:193) (low risk NIPs)  ---------------------------------------------------------------------- Vital Signs  Weight (lb): 176                               Height:        5'5"  BMI:         29.28 ---------------------------------------------------------------------- Fetal Evaluation  Num Of Fetuses:         1  Fetal Heart Rate(bpm):  144  Cardiac Activity:       Observed  Presentation:           Cephalic  Placenta:               Anterior  P. Cord Insertion:      Previously Visualized  Amniotic Fluid  AFI FV:      Subjectively decreased  AFI Sum(cm)     %Tile       Largest Pocket(cm)  3.79            < 3         2.19  RUQ(cm)                     LUQ(cm)  2.19                        1.6 ---------------------------------------------------------------------- Biophysical Evaluation  Amniotic F.V:   Pocket => 2 cm two         F. Tone:        Observed                  planes  F. Movement:    Observed                   Score:  8/8  F. Breathing:   Observed ---------------------------------------------------------------------- Biometry  BPD:      94.8  mm     G. Age:  38w 4d         74  %    CI:        78.02   %    70 - 86                                                          FL/HC:      22.6   %    20.6 - 23.4  HC:      339.6  mm     G. Age:  39w 0d         17  %    HC/AC:      0.96        0.87 - 1.06  AC:       353   mm     G. Age:  39w 1d         77  %    FL/BPD:     81.1   %    71 - 87  FL:       76.9  mm     G. Age:  39w 2d         65  %    FL/AC:      21.8   %    20 - 24  Est. FW:    3699  gm      8 lb 2 oz     87  % ---------------------------------------------------------------------- OB History  Gravidity:    6         Term:   5        Prem:    0        SAB:   0  TOP:          0       Ectopic:  0        Living: 5 ---------------------------------------------------------------------- Gestational Age  LMP:           34w 6d        Date:  06/23/18                 EDD:   03/30/19  U/S Today:     39w 0d                                        EDD:   03/01/19  Best:          39w 0d     Det. By:  U/S  (11/17/18)          EDD:   03/01/19 ---------------------------------------------------------------------- Anatomy  Cranium:               Appears normal         Aortic Arch:            Previously seen  Cavum:                 Previously seen        Ductal Arch:  Previously seen  Ventricles:            Previously seen        Diaphragm:              Previously seen  Choroid Plexus:        Previously seen        Stomach:                Not well visualized  Cerebellum:            Previously seen        Abdomen:                Previously seen  Posterior Fossa:       Previously seen        Abdominal Wall:         Previously seen  Nuchal Fold:           Not applicable (>88    Cord Vessels:           Previously seen                         wks GA)  Face:                  Orbits and profile     Kidneys:                Appear normal                         previously seen  Lips:                  Previously seen        Bladder:                Appears normal  Thoracic:              Appears normal         Spine:                  Previously seen  Heart:                 Appears normal         Upper Extremities:      Previously seen                         (4CH, axis, and                         situs)  RVOT:                  Previously seen        Lower Extremities:      Previously seen  LVOT:                  Previously seen  Other:  Fetus appears to be a female prev seen. Heels and 5th digit visualized          previously. Technically difficult due to maternal habitus and fetal          position and movement.  ---------------------------------------------------------------------- Cervix Uterus Adnexa  Cervix  Not visualized (advanced GA >24wks) ---------------------------------------------------------------------- Impression  Normal interval growth.  Oligohydramios- fluid filled stomach not visualized but seen  previously.  Biophysical profile 8/8 ---------------------------------------------------------------------- Recommendations  To L&D  for delivery give oligohydramnios at term  Discussed with Dr. Si Raider ----------------------------------------------------------------------               Sander Nephew, MD Electronically Signed Final Report   02/22/2019 01:45 pm ----------------------------------------------------------------------  [place] Mfm Ob Follow Up  Result Date: 02/22/2019 ----------------------------------------------------------------------  OBSTETRICS REPORT                       (Signed Final 02/22/2019 01:45 pm) ---------------------------------------------------------------------- Patient Info  ID #:       [id number]                          D.O.B.:  01-Jan-1981 (38 yrs)  Name:       Marcia Long                  Visit Date: 02/22/2019 12:51 pm ---------------------------------------------------------------------- Performed By  Performed By:     Jeanene Erb BS,      Ref. Address:     Faculty                    RDMS  Attending:        Sander Nephew      Location[facility]                    MD                                       Fetal Care  Referred By:      Mora Bellman MD ---------------------------------------------------------------------- Orders   #  Description                          Code         Ordered By   1  [place] MFM OB FOLLOW UP                  85462.70     Sander Nephew   2  [place] MFM FETAL BPP WO NON              35009.38     [place]  ----------------------------------------------------------------------   #  Order #                    Accession #                 Episode #   1  [id number]                  [id number]  [id number]   2  [id number]                  [id number]                  [id number]  ---------------------------------------------------------------------- Indications   HIV affecting pregnancy, third trimester       O98.713   Late to prenatal care, third trimester         O09.33   [redacted] weeks gestation of pregnancy                Z3A.73   Advanced maternal age multigravida 63+,        O44.523   third trimester   Abnormal biochemical screen (quad) for         O28.9   Trisomy 21 (1:193) (low risk NIPs)  ---------------------------------------------------------------------- Vital Signs  Weight (lb): 176                               Height:        5'5"  BMI:         29.28 ---------------------------------------------------------------------- Fetal Evaluation  Num Of Fetuses:         1  Fetal Heart Rate(bpm):  144  Cardiac Activity:       Observed  Presentation:           Cephalic  Placenta:               Anterior  P. Cord Insertion:      Previously Visualized  Amniotic Fluid  AFI FV:      Subjectively decreased  AFI Sum(cm)     %Tile       Largest Pocket(cm)  3.79            < 3         2.19  RUQ(cm)                     LUQ(cm)  2.19                        1.6 ---------------------------------------------------------------------- Biophysical Evaluation  Amniotic F.V:   Pocket => 2 cm two         F. Tone:        Observed                  planes  F. Movement:    Observed                   Score:          8/8  F. Breathing:   Observed ---------------------------------------------------------------------- Biometry  BPD:      94.8  mm     G. Age:  38w 4d         74  %    CI:        78.02   %    70 - 86                                                          FL/HC:      22.6   %    20.6 - 23.4  HC:      339.6  mm     G. Age:   39w 0d         69  %    HC/AC:      0.96        0.87 - 1.06  AC:       353   mm     G. Age:  39w 1d         77  %    FL/BPD:     81.1   %    71 - 87  FL:       76.9  mm     G. Age:  39w 2d         65  %    FL/AC:      21.8   %    20 - 24  Est. FW:    3699  gm      8 lb 2 oz     87  % ---------------------------------------------------------------------- OB History  Gravidity:    6         Term:   5        Prem:   0        SAB:   0  TOP:          0       Ectopic:  0        Living: 5 ---------------------------------------------------------------------- Gestational Age  LMP:           34w 6d        Date:  06/23/18                 EDD:   03/30/19  U/S Today:     39w 0d                                        EDD:   03/01/19  Best:          39w 0d     Det. By:  U/S  (11/17/18)          EDD:   03/01/19 ---------------------------------------------------------------------- Anatomy  Cranium:               Appears normal         Aortic Arch:            Previously seen  Cavum:                 Previously seen        Ductal Arch:            Previously seen  Ventricles:            Previously seen        Diaphragm:              Previously seen  Choroid Plexus:        Previously seen        Stomach:                Not well visualized  Cerebellum:            Previously seen        Abdomen:                Previously seen  Posterior Fossa:       Previously seen        Abdominal Wall:  Previously seen  Nuchal Fold:           Not applicable (>43    Cord Vessels:           Previously seen                         wks GA)  Face:                  Orbits and profile     Kidneys:                Appear normal                         previously seen  Lips:                  Previously seen        Bladder:                Appears normal  Thoracic:              Appears normal         Spine:                  Previously seen  Heart:                 Appears normal         Upper Extremities:      Previously seen                         (4CH,  axis, and                         situs)  RVOT:                  Previously seen        Lower Extremities:      Previously seen  LVOT:                  Previously seen  Other:  Fetus appears to be a female prev seen. Heels and 5th digit visualized          previously. Technically difficult due to maternal habitus and fetal          position and movement. ---------------------------------------------------------------------- Cervix Uterus Adnexa  Cervix  Not visualized (advanced GA >24wks) ---------------------------------------------------------------------- Impression  Normal interval growth.  Oligohydramios- fluid filled stomach not visualized but seen  previously.  Biophysical profile 8/8 ---------------------------------------------------------------------- Recommendations  To L&D for delivery give oligohydramnios at term  Discussed with Dr. Si Raider ----------------------------------------------------------------------               Sander Nephew, MD Electronically Signed Final Report   02/22/2019 01:45 pm ----------------------------------------------------------------------  MAU Course  Procedures  MDM -suspicious for C/S incision infection -consulted with Dr. Rosana Hoes re: physical exam findings, per Dr. Rosana Hoes, [place] to treat with antibiotics, no imaging needing, can discharge to home with good precautions of worsening s/sx -pt discharged to home in stable condition  Orders Placed This Encounter  Procedures  . Discharge patient    Order Specific Question:   Discharge disposition    Answer:   01-Home or Self Care [1]    Order Specific Question:   Discharge patient date    Answer:   03/06/2019   Meds ordered this encounter  Medications  .  cephALEXin (KEFLEX) 500 MG capsule    Sig: Take 1 capsule (500 mg total) by mouth 4 (four) times daily for 10 days.    Dispense:  40 capsule    Refill:  0    Order Specific Question:   Supervising Provider    Answer:   Sloan Leiter [[id number]]    Assessment  and Plan   1. Wound infection following cesarean section, postpartum    Allergies as of 03/06/2019   No Known Allergies     Medication List    TAKE these medications   cephALEXin 500 MG capsule Commonly known as:  KEFLEX Take 1 capsule (500 mg total) by mouth 4 (four) times daily for 10 days.   Comfort Fit Maternity Supp Med Misc Wear daily when ambulating   dolutegravir 50 MG tablet Commonly known as:  Tivicay Take 1 tablet (50 mg total) by mouth daily.   emtricitabine-tenofovir AF 200-25 MG tablet Commonly known as:  DESCOVY Take 1 tablet by mouth daily.   ibuprofen 600 MG tablet Commonly known as:  ADVIL Take 1 tablet (600 mg total) by mouth every 6 (six) hours as needed for headache, mild pain, moderate pain or cramping.   oxyCODONE-acetaminophen 5-325 MG tablet Commonly known as:  PERCOCET/ROXICET Take 1 tablet by mouth every 4 (four) hours as needed for moderate pain.   Prenate Mini 18-0.6-0.4-350 MG Caps Take 1 tablet by mouth daily.      -pt advised to cease using Desitin cream on C/S incision -discussed s/sx of infection/return MAU precautions -pt to return to MAU if incision worsens -pt palpated areas of swelling with provider and advised to monitor areas for growth -discussed appropriate skin care, pt given abdominal pads from MAU -discussed appropriate use of ABX including administration route, side effects, dosing -pt advised of office appt this Friday and strongly advised to keep appt for incision check -pt discharged to home in stable condition  Elmyra Ricks E Anastasija Anfinson 03/06/2019, 4:06 PM

## 2019-03-06 NOTE — MAU Note (Signed)
No interpreter available on [place], pt called someone from [facility] to interpret. Pt having pain in her incision and said that it's coming open. Also saying she's having pain in her incision. N Nugent NP in room to also talk to patient with interpreter.

## 2019-03-07 ENCOUNTER — Telehealth: Payer: Self-pay | Admitting: Family Medicine

## 2019-03-07 NOTE — Telephone Encounter (Signed)
Attempted to call patient with her appointment on Friday. Had to leave a voicemail message. Also left the address with appointment date and time.

## 2019-03-10 ENCOUNTER — Ambulatory Visit (INDEPENDENT_AMBULATORY_CARE_PROVIDER_SITE_OTHER): Payer: Medicaid Other

## 2019-03-10 ENCOUNTER — Telehealth: Payer: Self-pay | Admitting: Obstetrics and Gynecology

## 2019-03-10 ENCOUNTER — Other Ambulatory Visit: Payer: Self-pay

## 2019-03-10 DIAGNOSIS — Z5189 Encounter for other specified aftercare: Secondary | ICD-10-CM

## 2019-03-10 NOTE — Progress Notes (Signed)
Pt here today for incision check.  Pt incision has scar tissue that is across the entire incision.  Pt went to MAU on 03/06/19 in which incision was addressed and pt given antibiotics.  With interpreter Rayna Sexton pt reports that the antibiotics are working and the incision is better.  BP 117/61 RA.  Notified Dr. Alysia Penna, no new orders.  Pt given AVS with pp visit appt. Pt did not have any further questions or concerns.

## 2019-03-10 NOTE — Telephone Encounter (Signed)
The interrupter called to assist with the visit. Placed the call back number in the notes.

## 2019-03-10 NOTE — Progress Notes (Signed)
Agree with A & P. 

## 2019-03-21 ENCOUNTER — Telehealth: Payer: Self-pay

## 2019-03-21 ENCOUNTER — Other Ambulatory Visit: Payer: Self-pay

## 2019-03-21 MED ORDER — EMTRICITABINE-TENOFOVIR DF 200-300 MG PO TABS: 1.0000 | ORAL_TABLET | Freq: Every day | ORAL | 1 refills | Status: DC

## 2019-03-21 MED ORDER — DOLUTEGRAVIR SODIUM 50 MG PO TABS: 50.0000 mg | ORAL_TABLET | Freq: Every day | ORAL | 1 refills | Status: DC

## 2019-03-21 NOTE — Telephone Encounter (Signed)
We can switch her to [name] - she liked the Tivicay and Truvada better than her [place].  Congratulations on her long awaited little boy!

## 2019-03-21 NOTE — Telephone Encounter (Signed)
Social Worker with [facility] called today.  Marcia Long was there with baby and informed social worker she was almost out of medication.   Since there is a language barrier they decided to call our office .    Pt is requesting Tivicay and Truvada.  We will refill this regimen and she should follow up with Judeth Cornfield in June, 2020.

## 2019-03-21 NOTE — Telephone Encounter (Signed)
Patient has delivered. What regimen would you like to continue?

## 2019-03-21 NOTE — Addendum Note (Signed)
Addended by: Laurell Josephs on: 03/21/2019 04:51 PM   Modules accepted: Orders

## 2019-03-31 ENCOUNTER — Ambulatory Visit: Payer: Medicaid Other | Admitting: Advanced Practice Midwife

## 2019-03-31 ENCOUNTER — Ambulatory Visit (INDEPENDENT_AMBULATORY_CARE_PROVIDER_SITE_OTHER): Payer: Medicaid Other | Admitting: Obstetrics & Gynecology

## 2019-03-31 ENCOUNTER — Other Ambulatory Visit: Payer: Self-pay

## 2019-03-31 DIAGNOSIS — Z1389 Encounter for screening for other disorder: Secondary | ICD-10-CM

## 2019-03-31 DIAGNOSIS — Z9889 Other specified postprocedural states: Secondary | ICD-10-CM

## 2019-03-31 NOTE — Progress Notes (Signed)
I connected with  Denice Bors on 03/31/19 at  8:55 AM EDT by telephone and verified that I am speaking with the correct person using two identifiers.   I discussed the limitations, risks, security and privacy concerns of performing an evaluation and management service by telephone and the availability of in person appointments. I also discussed with the patient that there may be a patient responsible charge related to this service. The patient expressed understanding and agreed to proceed.  Ernestina Patches, CMA 03/31/2019  9:03 AM  Subjective:     Marcia Long is a 38 y.o. female who presents for a postpartum visit. She is 4 weeks postpartum following a low cervical transverse Cesarean section. I have fully reviewed the prenatal and intrapartum course. The delivery was at 39.2 gestational weeks. Outcome: primary low transverse csection. Anesthesia: spinal. Postpartum course has been unremarkable. Baby's course has been unremarkable. Baby is feeding by bottle - gerber goodstart. Bleeding no bleeding. Bowel function is normal. Bladder function is normal. Patient is not sexually active. Contraception method is BTL done with her C/S. Postpartum depression screening: negative.  The following portions of the patient's history were reviewed and updated as appropriate: allergies, current medications, past family history, past medical history, past social history, past surgical history and problem list.  Review of Systems Pertinent items are noted in HPI.   Objective:    There were no vitals taken for this visit.                                         Assessment:    Normal postpartum exam. Pap smear not done at today's visit.   Plan:    1. Contraception: tubal ligation  She will need an annual exam in 2 years/prn sooner GDM - will need a 2 hour GTT in [redacted] weeks along with a incision check HIV- sees ID

## 2019-03-31 NOTE — Progress Notes (Signed)
0845::Called pt using interpreter Marcia Long regarding her appointment.  Pt did not pick up.  Left voicemail advising pt that she was being contacted regarding her appointment and advising her that she will be contacted again in 15 minutes.

## 2019-04-19 ENCOUNTER — Ambulatory Visit (INDEPENDENT_AMBULATORY_CARE_PROVIDER_SITE_OTHER): Payer: Medicaid Other | Admitting: Infectious Diseases

## 2019-04-19 ENCOUNTER — Other Ambulatory Visit: Payer: Self-pay

## 2019-04-19 ENCOUNTER — Encounter: Payer: Self-pay | Admitting: Infectious Diseases

## 2019-04-19 VITALS — BP 126/73 | HR 60 | Temp 98.1°F | Wt 154.0 lb

## 2019-04-19 DIAGNOSIS — B20 Human immunodeficiency virus [HIV] disease: Secondary | ICD-10-CM

## 2019-04-19 DIAGNOSIS — Z0000 Encounter for general adult medical examination without abnormal findings: Secondary | ICD-10-CM | POA: Diagnosis not present

## 2019-04-19 DIAGNOSIS — B356 Tinea cruris: Secondary | ICD-10-CM | POA: Diagnosis not present

## 2019-04-19 MED ORDER — FLUCONAZOLE 200 MG PO TABS: 200.0000 mg | ORAL_TABLET | ORAL | 0 refills | Status: DC

## 2019-04-19 MED ORDER — TIVICAY 50 MG PO TABS: 50.0000 mg | ORAL_TABLET | Freq: Every day | ORAL | 11 refills | Status: DC

## 2019-04-19 MED ORDER — EMTRICITABINE-TENOFOVIR AF 200-25 MG PO TABS: 1.0000 | ORAL_TABLET | Freq: Every day | ORAL | 11 refills | Status: DC

## 2019-04-19 NOTE — Progress Notes (Signed)
Name: Marcia Long  DOB: 1980/10/29 MRN: [id number] PCP: Patient, No Pcp Per    Patient Active Problem List   Diagnosis Date Noted  . Tinea cruris 04/23/2019  . Healthcare maintenance 04/23/2019  . Human immunodeficiency virus (HIV) disease ([place]) 11/30/2018  . Chronic dental pain 11/30/2018  . Language barrier 11/03/2018     Brief Narrative:  Marcia Long is a 38 y.o. female with HIV disease, dx 2006. Immigrated from [id number] as refugee. Native language [place]. In care at [facility] previously. Married, husband HIV+. A refugee. History of OIs: unknown.  Previous Regimens: . Odefsey 2017 >> undetectable . Tivicay + Truvada during pregnancy 2019/2020 . Tivicay + Descovy 03-2019  Genotypes: . 2017 chart records indicate no major RT, PI, INSTI mutations  Subjective:  CC:  Routine follow-up care for HIV disease. Has a very itchy rash on the groin. Marcia Long Telephone interpreter to facilitate the visit.  HPI:  Marcia Long is doing well.  She required a C-section for her baby boy for oligohydramnios.  Underwent bilateral tubal ligation at this time for contraception management.  She is bottlefeeding her son.  He is nearly 2 months old.  Completed his medications for HIV prophylaxis.  She is taking Truvada and Tivicay.  She would like to stay on this regimen as it has not afforded any side effects for her and she likes it very much.  She is not missed any doses.  She is getting her medications mailed to her from from specialty [facility] in [place].  Only concern today is a vaginal rash.  She tells me that a week ago she noticed external itching and burning with urination. She denies any vaginal discharge that has changed in quality or quantity. No itching "inside" only outside. Exacerbated by wearing underwear so she has stopped. She has not tried anything over the counter to help with symptoms. She has not had any chills/fevers. Describes no  boils or lumps. No pain with sex. She has never experienced this before.   Review of Systems  All other systems reviewed and are negative.   Past Medical History:  Diagnosis Date  . HIV (human immunodeficiency virus infection) ([place])   . HIV (human immunodeficiency virus infection) ([place]) 11/30/2018   Dx 2006 HLA neg Quantiferon neg Hep B sAg neg      Outpatient Medications Prior to Visit  Medication Sig Dispense Refill  . Elastic Bandages & Supports (COMFORT FIT MATERNITY SUPP MED) MISC Wear daily when ambulating (Patient not taking: Reported on 03/31/2019) 1 each 0  . oxyCODONE-acetaminophen (PERCOCET/ROXICET) 5-325 MG tablet Take 1 tablet by mouth every 4 (four) hours as needed for moderate pain. (Patient not taking: Reported on 03/31/2019) 20 tablet 0  . dolutegravir (TIVICAY) 50 MG tablet Take 1 tablet (50 mg total) by mouth daily. 30 tablet 1  . emtricitabine-tenofovir (TRUVADA) 200-300 MG tablet Take 1 tablet by mouth daily. 30 tablet 1  . emtricitabine-tenofovir AF (DESCOVY) 200-25 MG tablet Take 1 tablet by mouth daily. 30 tablet 11   No facility-administered medications prior to visit.      No Known Allergies  Social History   Tobacco Use  . Smoking status: Never Smoker  . Smokeless tobacco: Never Used  Substance Use Topics  . Alcohol use: Not Currently  . Drug use: Not Currently    Social History   Substance and Sexual Activity  Sexual Activity Yes  . Birth control/protection: None     Objective:  Vitals:   04/19/19 1531  BP: 126/73  Pulse: 60  Temp: 98.1 F (36.7 C)  TempSrc: Oral  Weight: 154 lb (69.9 kg)   Body mass index is 25.24 kg/m.  Physical Exam Vitals signs reviewed. Exam conducted with a chaperone present.  Constitutional:      Appearance: Normal appearance.  Cardiovascular:     Rate and Rhythm: Normal rate and regular rhythm.  Abdominal:     Tenderness: There is no abdominal tenderness.  Genitourinary:    Exam position: Lithotomy  position.     Pubic Area: Rash (grey colored plaques noted to intertriginous area surrounding outer labia. There are a few fissures with full thickness skin loss with no drainage or signs of secondary bacterial infection. Area appears macerated.) present.  Lymphadenopathy:     Lower Body: No right inguinal adenopathy. No left inguinal adenopathy.  Neurological:     Mental Status: She is alert.     Lab Results Lab Results  Component Value Date   WBC 18.1 (H) 02/25/2019   HGB 9.8 (L) 02/25/2019   HCT 29.5 (L) 02/25/2019   MCV 87.0 02/25/2019   PLT 151 02/25/2019    Lab Results  Component Value Date   CREATININE 0.95 02/25/2019   BUN 8 11/15/2018   NA 138 11/15/2018   K 4.0 11/15/2018   CL 105 11/15/2018   CO2 26 11/15/2018    Lab Results  Component Value Date   ALT 15 11/15/2018   AST 12 11/15/2018   BILITOT 0.3 11/15/2018    Lab Results  Component Value Date   CHOL 185 11/15/2018   HDL 75 11/15/2018   LDLCALC 92 11/15/2018   TRIG 88 11/15/2018   CHOLHDL 2.5 11/15/2018   HIV 1 RNA Quant (copies/mL)  Date Value  02/23/2019 60  02/21/2019 <20 NOT DETECTED  01/24/2019 <20 NOT DETECTED   CD4 T Cell Abs (/uL)  Date Value  11/15/2018 800     Assessment & Plan:   Problem List Items Addressed This Visit      Unprioritized   Healthcare maintenance    Pneumovax at upcoming visit if she will allow + annual flu vaccine.  Hep B sAb +, cAb + indicating cleared previous infection with immunity s/p vaccination 2016.       Human immunodeficiency virus (HIV) disease ([place])    She likes her current regimen and has noticed less side effects compared to the [facility]. Will continue Tivicay once a day and change Truvada to Descovy once a day. She continues to have excellent adherence.  Will have her follow up in 4 months to check in again. Due for pap in September.       Relevant Medications   dolutegravir (TIVICAY) 50 MG tablet   emtricitabine-tenofovir AF (DESCOVY) 200-25  MG tablet   fluconazole (DIFLUCAN) 200 MG tablet   Tinea cruris - Primary    Rash is most consistent with tinea cruris. She has soma few fissures from macerated skin. No signs of secondary bacterial infection at this time. Will treat with Diflucan 200 mg Qweek x 4 doses.  She would like to pick up medication asap however extreme difficulty with language barrier to direct her to [place] on [address] - will send in to [place] to have this mailed in the next 2 days. I gave her topical clotrimazole OTC for now to start using.       Relevant Medications   dolutegravir (TIVICAY) 50 MG tablet   emtricitabine-tenofovir AF (DESCOVY) 200-25  MG tablet   fluconazole (DIFLUCAN) 200 MG tablet     I spent 15 minutes with the patient discussion directly of the above, intrapartum and postpartum expectations for HIV-positive mother   Janene Madeira, MSN, NP-C [facility] for [place] Pager: [phone] Office: [phone]  04/23/19  1:08 PM

## 2019-04-23 DIAGNOSIS — Z0000 Encounter for general adult medical examination without abnormal findings: Secondary | ICD-10-CM | POA: Insufficient documentation

## 2019-04-23 DIAGNOSIS — B356 Tinea cruris: Secondary | ICD-10-CM | POA: Insufficient documentation

## 2019-04-23 DIAGNOSIS — Z0289 Encounter for other administrative examinations: Secondary | ICD-10-CM | POA: Insufficient documentation

## 2019-04-23 NOTE — Assessment & Plan Note (Signed)
Rash is most consistent with tinea cruris. She has soma few fissures from macerated skin. No signs of secondary bacterial infection at this time. Will treat with Diflucan 200 mg Qweek x 4 doses.  She would like to pick up medication asap however extreme difficulty with language barrier to direct her to [place] on [address] - will send in to [place] to have this mailed in the next 2 days. I gave her topical clotrimazole OTC for now to start using.

## 2019-04-23 NOTE — Assessment & Plan Note (Signed)
Pneumovax at upcoming visit if she will allow + annual flu vaccine.  Hep B sAb +, cAb + indicating cleared previous infection with immunity s/p vaccination 2016.

## 2019-04-23 NOTE — Assessment & Plan Note (Signed)
She likes her current regimen and has noticed less side effects compared to the [facility]. Will continue Tivicay once a day and change Truvada to Descovy once a day. She continues to have excellent adherence.  Will have her follow up in 4 months to check in again. Due for pap in September.

## 2019-05-19 ENCOUNTER — Other Ambulatory Visit: Payer: Self-pay | Admitting: Infectious Diseases

## 2019-05-23 ENCOUNTER — Ambulatory Visit: Payer: Medicaid Other | Admitting: Infectious Diseases

## 2019-07-28 ENCOUNTER — Other Ambulatory Visit: Payer: Self-pay | Admitting: *Deleted

## 2019-07-28 DIAGNOSIS — B20 Human immunodeficiency virus [HIV] disease: Secondary | ICD-10-CM

## 2019-07-31 ENCOUNTER — Other Ambulatory Visit: Payer: Self-pay

## 2019-07-31 ENCOUNTER — Other Ambulatory Visit: Payer: Medicaid Other

## 2019-07-31 DIAGNOSIS — B20 Human immunodeficiency virus [HIV] disease: Secondary | ICD-10-CM

## 2019-08-01 LAB — T-HELPER CELL (CD4) - (RCID CLINIC ONLY)
CD4 % Helper T Cell: 48 % (ref 33–65)
CD4 T Cell Abs: 813 /uL (ref 400–1790)

## 2019-08-02 LAB — CBC WITH DIFFERENTIAL/PLATELET
Absolute Monocytes: 519 cells/uL (ref 200–950)
Basophils Absolute: 20 cells/uL (ref 0–200)
Basophils Relative: 0.5 %
Eosinophils Absolute: 218 cells/uL (ref 15–500)
Eosinophils Relative: 5.6 %
HCT: 37.6 % (ref 35.0–45.0)
Hemoglobin: 12.8 g/dL (ref 11.7–15.5)
Lymphs Abs: 1564 cells/uL (ref 850–3900)
MCH: 29 pg (ref 27.0–33.0)
MCHC: 34 g/dL (ref 32.0–36.0)
MCV: 85.3 fL (ref 80.0–100.0)
MPV: 10.5 fL (ref 7.5–12.5)
Monocytes Relative: 13.3 %
Neutro Abs: 1580 cells/uL (ref 1500–7800)
Neutrophils Relative %: 40.5 %
Platelets: 285 10*3/uL (ref 140–400)
RBC: 4.41 10*6/uL (ref 3.80–5.10)
RDW: 14 % (ref 11.0–15.0)
Total Lymphocyte: 40.1 %
WBC: 3.9 10*3/uL (ref 3.8–10.8)

## 2019-08-02 LAB — COMPLETE METABOLIC PANEL WITH GFR
AG Ratio: 1.4 (calc) (ref 1.0–2.5)
ALT: 25 U/L (ref 6–29)
AST: 14 U/L (ref 10–30)
Albumin: 4.1 g/dL (ref 3.6–5.1)
Alkaline phosphatase (APISO): 78 U/L (ref 31–125)
BUN: 19 mg/dL (ref 7–25)
CO2: 28 mmol/L (ref 20–32)
Calcium: 8.9 mg/dL (ref 8.6–10.2)
Chloride: 104 mmol/L (ref 98–110)
Creat: 0.97 mg/dL (ref 0.50–1.10)
GFR, Est African American: 86 mL/min/{1.73_m2} (ref >=60)
GFR, Est Non African American: 74 mL/min/{1.73_m2} (ref >=60)
Globulin: 3 g/dL (calc) (ref 1.9–3.7)
Glucose, Bld: 159 mg/dL — ABNORMAL HIGH (ref 65–99)
Potassium: 3.9 mmol/L (ref 3.5–5.3)
Sodium: 139 mmol/L (ref 135–146)
Total Bilirubin: 0.4 mg/dL (ref 0.2–1.2)
Total Protein: 7.1 g/dL (ref 6.1–8.1)

## 2019-08-02 LAB — HIV-1 RNA QUANT-NO REFLEX-BLD
HIV 1 RNA Quant: <20 copies/mL
HIV-1 RNA Quant, Log: <1.3 Log copies/mL

## 2019-08-14 ENCOUNTER — Other Ambulatory Visit: Payer: Self-pay

## 2019-08-14 ENCOUNTER — Encounter: Payer: Self-pay | Admitting: Infectious Diseases

## 2019-08-14 ENCOUNTER — Ambulatory Visit (INDEPENDENT_AMBULATORY_CARE_PROVIDER_SITE_OTHER): Payer: Medicaid Other | Admitting: Infectious Diseases

## 2019-08-14 VITALS — BP 112/68 | HR 71 | Wt 186.0 lb

## 2019-08-14 DIAGNOSIS — R7309 Other abnormal glucose: Secondary | ICD-10-CM | POA: Diagnosis not present

## 2019-08-14 DIAGNOSIS — Z0000 Encounter for general adult medical examination without abnormal findings: Secondary | ICD-10-CM

## 2019-08-14 DIAGNOSIS — Z23 Encounter for immunization: Secondary | ICD-10-CM

## 2019-08-14 DIAGNOSIS — Z789 Other specified health status: Secondary | ICD-10-CM | POA: Diagnosis not present

## 2019-08-14 DIAGNOSIS — B20 Human immunodeficiency virus [HIV] disease: Secondary | ICD-10-CM

## 2019-08-14 NOTE — Assessment & Plan Note (Addendum)
This is a new problem.  Explained that with her history of gestational diabetes she is at higher risk to progress to diabetes outside of pregnancy. Given her fasting blood sugar was nearly 160 recently and she has gained 30 lbs since her last visit in June I worry that she may be diabetic and require medication. Will check A1C today (she had tea with sugar shortly before coming to office today so unable to repeat fasting blood glucose).  Will refer to PCP for care.

## 2019-08-14 NOTE — Progress Notes (Signed)
Name: Marcia Long  DOB: 31-Aug-1981 MRN: [id number] PCP: Patient, No Pcp Per    Patient Active Problem List   Diagnosis Date Noted  . Human immunodeficiency virus (HIV) disease ([place]) 11/30/2018    Priority: High  . Elevated glucose level 08/14/2019  . Tinea cruris 04/23/2019  . Healthcare maintenance 04/23/2019  . Chronic dental pain 11/30/2018  . Language barrier 11/03/2018     Brief Narrative:  Marcia Long is a 38 y.o. female with HIV disease, dx 2006. Immigrated from [place] [phone] as refugee. Native language [place]. In care at [facility] previously. Married, husband HIV+.  History of OIs: unknown.  Previous Regimens: . Odefsey 2017 >> undetectable . Tivicay + Truvada during pregnancy 2019/2020 . Tivicay + Descovy 03-2019  Genotypes: . 2017 chart records indicate no major RT, PI, INSTI mutations  Subjective:  CC:  Routine follow-up care for HIV disease. Has bills from hospital stay. Dental pain.  In person [place] interpretor present today to facilitate visit.    HPI:  Marcia Long is doing well. She is taking her Tivicay + Descovy every day and has not missed a dose. She has no concerns with side effects of her medications or access. Her son is now 80 months old and has been growing and sleeping well. She is married and sexually active with husband.   She has questions about her Medicaid status as she keeps getting bills from the hospital. She is uncertain as to who to reach out to. Reports her caseworker is only someone that helps with employment. She also has vouchers today from [facility] and uncertain what they are for.   She needs dental care; has a broken tooth in the lower left mandible that has caused chronic pain.   She has a history of gestational diabetes and recent fasting blood sugar > 150 with labs 2 weeks ago. She has gained about 30 lbs since June 2020. She is    Review of Systems  Constitutional: Negative for chills,  fever, malaise/fatigue and weight loss.  HENT: Negative for sore throat.        No dental problems  Respiratory: Negative for cough and sputum production.   Cardiovascular: Negative for chest pain and leg swelling.  Gastrointestinal: Negative for abdominal pain, diarrhea and vomiting.  Genitourinary: Negative for dysuria and flank pain.  Musculoskeletal: Negative for joint pain, myalgias and neck pain.  Skin: Negative for rash.  Neurological: Negative for dizziness, tingling and headaches.  Psychiatric/Behavioral: Negative for depression and substance abuse. The patient is not nervous/anxious and does not have insomnia.     Past Medical History:  Diagnosis Date  . HIV (human immunodeficiency virus infection) ([place])   . HIV (human immunodeficiency virus infection) ([place]) 11/30/2018   Dx 2006 HLA neg Quantiferon neg Hep B sAg neg      Outpatient Medications Prior to Visit  Medication Sig Dispense Refill  . dolutegravir (TIVICAY) 50 MG tablet Take 1 tablet (50 mg total) by mouth daily. 30 tablet 11  . Elastic Bandages & Supports (COMFORT FIT MATERNITY SUPP MED) MISC Wear daily when ambulating (Patient not taking: Reported on 03/31/2019) 1 each 0  . emtricitabine-tenofovir AF (DESCOVY) 200-25 MG tablet Take 1 tablet by mouth daily. 30 tablet 11  . fluconazole (DIFLUCAN) 200 MG tablet Take 1 tablet (200 mg total) by mouth once a week. 4 tablet 0  . oxyCODONE-acetaminophen (PERCOCET/ROXICET) 5-325 MG tablet Take 1 tablet by mouth every 4 (four) hours as needed  for moderate pain. (Patient not taking: Reported on 03/31/2019) 20 tablet 0   No facility-administered medications prior to visit.      No Known Allergies  Social History   Tobacco Use  . Smoking status: Never Smoker  . Smokeless tobacco: Never Used  Substance Use Topics  . Alcohol use: Not Currently  . Drug use: Not Currently    Social History   Substance and Sexual Activity  Sexual Activity Yes  . Birth control/protection:  None     Objective:   Vitals:   08/14/19 0953  BP: 112/68  Pulse: 71  Weight: 186 lb (84.4 kg)   Body mass index is 30.48 kg/m.  Physical Exam Constitutional:      Appearance: She is well-developed.     Comments: Seated comfortably in chair.   HENT:     Mouth/Throat:     Mouth: No oral lesions.     Dentition: Normal dentition. No dental abscesses.     Pharynx: No oropharyngeal exudate.  Cardiovascular:     Rate and Rhythm: Normal rate and regular rhythm.     Heart sounds: Normal heart sounds.  Pulmonary:     Effort: Pulmonary effort is normal.     Breath sounds: Normal breath sounds.  Abdominal:     General: There is no distension.     Palpations: Abdomen is soft.     Tenderness: There is no abdominal tenderness.  Lymphadenopathy:     Cervical: No cervical adenopathy.  Skin:    General: Skin is warm and dry.     Findings: No rash.  Neurological:     Mental Status: She is alert and oriented to person, place, and time.  Psychiatric:        Judgment: Judgment normal.     Comments: In good spirits today.     Lab Results Lab Results  Component Value Date   WBC 3.9 07/31/2019   HGB 12.8 07/31/2019   HCT 37.6 07/31/2019   MCV 85.3 07/31/2019   PLT 285 07/31/2019    Lab Results  Component Value Date   CREATININE 0.97 07/31/2019   BUN 19 07/31/2019   NA 139 07/31/2019   K 3.9 07/31/2019   CL 104 07/31/2019   CO2 28 07/31/2019    Lab Results  Component Value Date   ALT 25 07/31/2019   AST 14 07/31/2019   BILITOT 0.4 07/31/2019    Lab Results  Component Value Date   CHOL 185 11/15/2018   HDL 75 11/15/2018   LDLCALC 92 11/15/2018   TRIG 88 11/15/2018   CHOLHDL 2.5 11/15/2018   HIV 1 RNA Quant (copies/mL)  Date Value  07/31/2019 <20 NOT DETECTED  02/23/2019 60  02/21/2019 <20 NOT DETECTED   CD4 T Cell Abs (/uL)  Date Value  07/31/2019 813  11/15/2018 800     Assessment & Plan:   Problem List Items Addressed This Visit      High    Human immunodeficiency virus (HIV) disease ([place]) - Primary (Chronic)    Reviewed her blood work today and she continues to be exceptionally well controlled with VL < 20 and CD4 813. Will continue Tivicay + Descovy for her.  Will have her return in 3 months to update her annual pap smear for cervical cancer screening. She will be due for mammogram screenings at the age of 63.  STI test to be conducted off pap smear.  Flu and Pneumovax booster given today.  Dental form completed to schedule appointment  with [facility].       Relevant Orders   Hemoglobin A1c     Unprioritized   Language barrier    With her permission I will email the representative that mailed the [facility] vouchers to get more information for her about rent payment and what the purpose of vouchers are.  We provided her with DSS main number to help her investigate her Medicaid status and the bills she is receiving.        Healthcare maintenance    Pap smear due December 2020.       Elevated glucose level    This is a new problem.  Explained that with her history of gestational diabetes she is at higher risk to progress to diabetes outside of pregnancy. Given her fasting blood sugar was nearly 160 recently and she has gained 30 lbs since her last visit in June I worry that she may be diabetic and require medication. Will check A1C today (she had tea with sugar shortly before coming to office today so unable to repeat fasting blood glucose).  Will refer to PCP for care.         I spent 25 minutes with the patient 50% of which was spent in direct face to face care and discussion of the above the rest spent in coordination of her care.   Janene Madeira, MSN, NP-C [facility] for Infectious [place] Pager: [phone] Office: [phone]  08/14/19  10:40 AM

## 2019-08-14 NOTE — Assessment & Plan Note (Addendum)
Reviewed her blood work today and she continues to be exceptionally well controlled with VL < 20 and CD4 813. Will continue Tivicay + Descovy for her.  Will have her return in 3 months to update her annual pap smear for cervical cancer screening. She will be due for mammogram screenings at the age of 39.  STI test to be conducted off pap smear.  Flu and Pneumovax booster given today.  Dental form completed to schedule appointment with [facility].

## 2019-08-14 NOTE — Addendum Note (Signed)
Addended by: Reggy Eye on: 08/14/2019 02:21 PM   Modules accepted: Orders

## 2019-08-14 NOTE — Patient Instructions (Addendum)
Please continue taking your Tivicay + Descovy every day.   Please schedule an appointment back in December/January to update your pap smear.   Vaccines today 1. Flu 2. Pneumonia   For your Medicaid, unfortunately we cannot call for you to figure out your bills. If you can call [phone] this is the main number. Please have your Medicaid card available when you call. You should have a case worker assigned to you that can help you.

## 2019-08-14 NOTE — Assessment & Plan Note (Signed)
Pap smear due December 2020.

## 2019-08-14 NOTE — Assessment & Plan Note (Signed)
With her permission I will email the representative that mailed the [facility] vouchers to get more information for her about rent payment and what the purpose of vouchers are.  We provided her with DSS main number to help her investigate her Medicaid status and the bills she is receiving.

## 2019-08-15 LAB — HEMOGLOBIN A1C
Hgb A1c MFr Bld: 5.9 % of total Hgb — ABNORMAL HIGH (ref <5.7)
Mean Plasma Glucose: 123 (calc)
eAG (mmol/L): 6.8 (calc)

## 2019-08-17 NOTE — Progress Notes (Signed)
Please call Marcia Long to let her know that her blood work reveals that she has early findings of diabetes. She can reverse this with weight loss and changing her diet. If she would like to schedule an appointment with an interpretor present we can go over this more in depth so she does not progress to where she needs diabetes medications.   I heard back from the person who sent the vouchers - they are for free diapers/wipes. I also was told that the caseworker has been in touch with her also and would encourage her to reach out to her with any further questions. She seems very helpful.

## 2019-09-06 ENCOUNTER — Telehealth: Payer: Self-pay | Admitting: *Deleted

## 2019-09-06 NOTE — Telephone Encounter (Signed)
Per Colletta Maryland called patient and spoke to her through her sister and advised her of the results and that if she wants to come and talk with Colletta Maryland sooner we can schedule a sooner appt. She advised she will call us back but she will try to use diet lose weight first. Advised her to call the office if she has any questions.

## 2019-11-14 ENCOUNTER — Ambulatory Visit: Payer: Medicaid Other | Admitting: Infectious Diseases

## 2019-12-08 ENCOUNTER — Other Ambulatory Visit: Payer: Medicaid Other

## 2019-12-08 ENCOUNTER — Other Ambulatory Visit: Payer: Self-pay

## 2019-12-08 ENCOUNTER — Other Ambulatory Visit (HOSPITAL_COMMUNITY)
Admission: RE | Admit: 2019-12-08 | Discharge: 2019-12-08 | Disposition: A | Discharge status: Home / Self Care | Payer: Medicaid Other | Source: Ambulatory Visit | Attending: Infectious Diseases | Admitting: Infectious Diseases

## 2019-12-08 DIAGNOSIS — B20 Human immunodeficiency virus [HIV] disease: Secondary | ICD-10-CM | POA: Insufficient documentation

## 2019-12-08 DIAGNOSIS — Z79899 Other long term (current) drug therapy: Secondary | ICD-10-CM | POA: Diagnosis not present

## 2019-12-08 DIAGNOSIS — Z113 Encounter for screening for infections with a predominantly sexual mode of transmission: Secondary | ICD-10-CM

## 2019-12-08 LAB — T-HELPER CELL (CD4) - (RCID CLINIC ONLY)
CD4 % Helper T Cell: 46 % (ref 33–65)
CD4 T Cell Abs: 840 /uL (ref 400–1790)

## 2019-12-11 LAB — LIPID PANEL
Cholesterol: 148 mg/dL (ref <200)
HDL: 64 mg/dL (ref >=50)
LDL Cholesterol (Calc): 71 mg/dL (calc)
Non-HDL Cholesterol (Calc): 84 mg/dL (calc) (ref <130)
Total CHOL/HDL Ratio: 2.3 (calc) (ref <5.0)
Triglycerides: 44 mg/dL (ref <150)

## 2019-12-11 LAB — HIV-1 RNA QUANT-NO REFLEX-BLD
HIV 1 RNA Quant: <20 copies/mL — AB
HIV-1 RNA Quant, Log: <1.3 Log copies/mL — AB

## 2019-12-11 LAB — CBC WITH DIFFERENTIAL/PLATELET
Absolute Monocytes: 511 cells/uL (ref 200–950)
Basophils Absolute: 40 cells/uL (ref 0–200)
Basophils Relative: 1.1 %
Eosinophils Absolute: 259 cells/uL (ref 15–500)
Eosinophils Relative: 7.2 %
HCT: 39.4 % (ref 35.0–45.0)
Hemoglobin: 13.2 g/dL (ref 11.7–15.5)
Lymphs Abs: 1789 cells/uL (ref 850–3900)
MCH: 29.5 pg (ref 27.0–33.0)
MCHC: 33.5 g/dL (ref 32.0–36.0)
MCV: 87.9 fL (ref 80.0–100.0)
MPV: 10.3 fL (ref 7.5–12.5)
Monocytes Relative: 14.2 %
Neutro Abs: 1001 cells/uL — ABNORMAL LOW (ref 1500–7800)
Neutrophils Relative %: 27.8 %
Platelets: 256 10*3/uL (ref 140–400)
RBC: 4.48 10*6/uL (ref 3.80–5.10)
RDW: 12.7 % (ref 11.0–15.0)
Total Lymphocyte: 49.7 %
WBC: 3.6 10*3/uL — ABNORMAL LOW (ref 3.8–10.8)

## 2019-12-11 LAB — COMPLETE METABOLIC PANEL WITH GFR
AG Ratio: 1.5 (calc) (ref 1.0–2.5)
ALT: 16 U/L (ref 6–29)
AST: 12 U/L (ref 10–30)
Albumin: 4.1 g/dL (ref 3.6–5.1)
Alkaline phosphatase (APISO): 73 U/L (ref 31–125)
BUN: 16 mg/dL (ref 7–25)
CO2: 27 mmol/L (ref 20–32)
Calcium: 9 mg/dL (ref 8.6–10.2)
Chloride: 104 mmol/L (ref 98–110)
Creat: 0.9 mg/dL (ref 0.50–1.10)
GFR, Est African American: 93 mL/min/{1.73_m2} (ref >=60)
GFR, Est Non African American: 81 mL/min/{1.73_m2} (ref >=60)
Globulin: 2.7 g/dL (calc) (ref 1.9–3.7)
Glucose, Bld: 96 mg/dL (ref 65–99)
Potassium: 4.4 mmol/L (ref 3.5–5.3)
Sodium: 140 mmol/L (ref 135–146)
Total Bilirubin: 0.3 mg/dL (ref 0.2–1.2)
Total Protein: 6.8 g/dL (ref 6.1–8.1)

## 2019-12-11 LAB — URINE CYTOLOGY ANCILLARY ONLY
Chlamydia: NEGATIVE
Comment: NEGATIVE
Comment: NORMAL
Neisseria Gonorrhea: NEGATIVE

## 2019-12-11 LAB — RPR: RPR Ser Ql: NONREACTIVE

## 2020-01-01 ENCOUNTER — Ambulatory Visit (INDEPENDENT_AMBULATORY_CARE_PROVIDER_SITE_OTHER): Payer: Medicaid Other | Admitting: Infectious Diseases

## 2020-01-01 ENCOUNTER — Encounter: Payer: Self-pay | Admitting: Infectious Diseases

## 2020-01-01 ENCOUNTER — Other Ambulatory Visit: Payer: Self-pay

## 2020-01-01 VITALS — BP 128/90 | HR 60 | Temp 98.0°F | Wt 194.0 lb

## 2020-01-01 DIAGNOSIS — Z23 Encounter for immunization: Secondary | ICD-10-CM | POA: Diagnosis not present

## 2020-01-01 DIAGNOSIS — K089 Disorder of teeth and supporting structures, unspecified: Secondary | ICD-10-CM

## 2020-01-01 DIAGNOSIS — Z79899 Other long term (current) drug therapy: Secondary | ICD-10-CM | POA: Diagnosis not present

## 2020-01-01 DIAGNOSIS — Z0000 Encounter for general adult medical examination without abnormal findings: Secondary | ICD-10-CM | POA: Diagnosis not present

## 2020-01-01 DIAGNOSIS — G8929 Other chronic pain: Secondary | ICD-10-CM

## 2020-01-01 DIAGNOSIS — B20 Human immunodeficiency virus [HIV] disease: Secondary | ICD-10-CM | POA: Diagnosis not present

## 2020-01-01 DIAGNOSIS — Z113 Encounter for screening for infections with a predominantly sexual mode of transmission: Secondary | ICD-10-CM | POA: Diagnosis not present

## 2020-01-01 DIAGNOSIS — Z21 Asymptomatic human immunodeficiency virus [HIV] infection status: Secondary | ICD-10-CM | POA: Diagnosis not present

## 2020-01-01 NOTE — Assessment & Plan Note (Signed)
She has perfect control with Tivicay + Descovy. I do worry a bit that the Tivicay is contributing to weight gain but in discussion it is multifactorial.  Will have her back in 6 months with labs prior to.  Pap smear at next appointment

## 2020-01-01 NOTE — Patient Instructions (Addendum)
Continue Tivicay and Descovy every day.   We will give you your meningitis  Will see her back in 6 months with labs 2 weeks before. We will need to do your pap smear soon also.   Please come here tomorrow at 3:00 in the afternoon so we can have you seen by the dentist and look at your tooth.

## 2020-01-01 NOTE — Progress Notes (Signed)
Name: Marcia Long  DOB: Aug 20, 1981 MRN: [id number] PCP: Patient, No Pcp Per     Patient Active Problem List   Diagnosis Date Noted  . Human immunodeficiency virus (HIV) disease ([place]) 11/30/2018    Priority: High  . Elevated glucose level 08/14/2019  . Healthcare maintenance 04/23/2019  . Chronic dental pain 11/30/2018  . Language barrier 11/03/2018     Brief Narrative:  Marcia Long is a 39 y.o. female with HIV disease, dx 2006. Immigrated from [place] of [place] [phone] as refugee. Native language [place]. In care at [facility] previously. Married, husband HIV+.  History of OIs: unknown.  Previous Regimens: . Odefsey 2017 >> undetectable . Tivicay + Truvada during pregnancy 2019/2020 . Tivicay + Descovy 03-2019  Genotypes: . 2017 chart records indicate no major RT, PI, INSTI mutations  Subjective:  CC:  Routine follow-up care for HIV disease.  No interpretor available - consent given for [place] to have her 16 year old daughter to interpret today including HIV details.    HPI:  She is doing well. Taking her Tivicay + Descovy every day and no trouble with side effects. She reports no missed doses since her last office visit.  Sexually active with only her husband.   She still needs dental care arranged -- has a broken tooth in the lower left mandible that has caused chronic pain.   She has a history of gestational diabetes. Weight increased since delivery of her last child of 40 lbs since June 2020. She is otherwise eating and sleeping well.    Wt Readings from Last 3 Encounters:  01/01/20 194 lb (88 kg)  08/14/19 186 lb (84.4 kg)  04/19/19 154 lb (69.9 kg)    Review of Systems  Constitutional: Negative for chills, fever, malaise/fatigue and weight loss.  HENT: Negative for sore throat.        Broken tooth / dental pain   Respiratory: Negative for cough and sputum production.   Cardiovascular: Negative for chest pain and leg swelling.    Gastrointestinal: Negative for abdominal pain, diarrhea and vomiting.  Genitourinary: Negative for dysuria and flank pain.  Musculoskeletal: Negative for joint pain, myalgias and neck pain.  Skin: Negative for rash.  Neurological: Negative for dizziness, tingling and headaches.  Psychiatric/Behavioral: Negative for depression and substance abuse. The patient is not nervous/anxious and does not have insomnia.     Past Medical History:  Diagnosis Date  . HIV (human immunodeficiency virus infection) ([place])   . HIV (human immunodeficiency virus infection) ([place]) 11/30/2018   Dx 2006 HLA neg Quantiferon neg Hep B sAg neg      Outpatient Medications Prior to Visit  Medication Sig Dispense Refill  . dolutegravir (TIVICAY) 50 MG tablet Take 1 tablet (50 mg total) by mouth daily. 30 tablet 11  . emtricitabine-tenofovir AF (DESCOVY) 200-25 MG tablet Take 1 tablet by mouth daily. 30 tablet 11  . Elastic Bandages & Supports (COMFORT FIT MATERNITY SUPP MED) MISC Wear daily when ambulating (Patient not taking: Reported on 03/31/2019) 1 each 0   No facility-administered medications prior to visit.     No Known Allergies  Social History   Tobacco Use  . Smoking status: Never Smoker  . Smokeless tobacco: Never Used  Substance Use Topics  . Alcohol use: Not Currently  . Drug use: Not Currently    Social History   Substance and Sexual Activity  Sexual Activity Yes  . Birth control/protection: None     Objective:  Vitals:   01/01/20 1055  BP: 128/90  Pulse: 60  Temp: 98 F (36.7 C)  SpO2: 99%  Weight: 194 lb (88 kg)   Body mass index is 31.79 kg/m.  Physical Exam Constitutional:      Appearance: She is well-developed.     Comments: Seated comfortably in chair.   HENT:     Mouth/Throat:     Mouth: No oral lesions.     Dentition: Normal dentition. No dental abscesses.     Pharynx: No oropharyngeal exudate.  Cardiovascular:     Rate and Rhythm: Normal rate and regular  rhythm.     Heart sounds: Normal heart sounds.  Pulmonary:     Effort: Pulmonary effort is normal.     Breath sounds: Normal breath sounds.  Abdominal:     General: There is no distension.     Palpations: Abdomen is soft.     Tenderness: There is no abdominal tenderness.  Lymphadenopathy:     Cervical: No cervical adenopathy.  Skin:    General: Skin is warm and dry.     Findings: No rash.  Neurological:     Mental Status: She is alert and oriented to person, place, and time.  Psychiatric:        Judgment: Judgment normal.     Comments: In good spirits today.     Lab Results Lab Results  Component Value Date   WBC 3.6 (L) 12/08/2019   HGB 13.2 12/08/2019   HCT 39.4 12/08/2019   MCV 87.9 12/08/2019   PLT 256 12/08/2019    Lab Results  Component Value Date   CREATININE 0.90 12/08/2019   BUN 16 12/08/2019   NA 140 12/08/2019   K 4.4 12/08/2019   CL 104 12/08/2019   CO2 27 12/08/2019    Lab Results  Component Value Date   ALT 16 12/08/2019   AST 12 12/08/2019   BILITOT 0.3 12/08/2019    Lab Results  Component Value Date   CHOL 148 12/08/2019   HDL 64 12/08/2019   LDLCALC 71 12/08/2019   TRIG 44 12/08/2019   CHOLHDL 2.3 12/08/2019   HIV 1 RNA Quant (copies/mL)  Date Value  12/08/2019 <20 DETECTED (A)  07/31/2019 <20 NOT DETECTED  02/23/2019 60   CD4 T Cell Abs (/uL)  Date Value  12/08/2019 840  07/31/2019 813  11/15/2018 800     Assessment & Plan:   Problem List Items Addressed This Visit      High   Human immunodeficiency virus (HIV) disease ([place]) (Chronic)    She has perfect control with Tivicay + Descovy. I do worry a bit that the Tivicay is contributing to weight gain but in discussion it is multifactorial.  Will have her back in 6 months with labs prior to.  Pap smear at next appointment         Ponder maintenance    Menveo booster today. Otherwise up to date.       Chronic dental pain    Has been challenging to  get her into see dental team with language barrier. Fortunately we were able to secure an appointment tomorrow at 3:00 pm for urgent evaluation for pain. She will bring her daughter to this appointment - will need a note for her to give to her school to excuse for 1/2 day.        Other Visit Diagnoses    Asymptomatic HIV infection ([place])    -  Primary  Relevant Orders   T-helper cell (CD4)- ([facility] only)   HIV-1 RNA quant-no reflex-bld   CBC   Comprehensive metabolic panel   Routine screening for STI (sexually transmitted infection)       Relevant Orders   RPR   High risk medication use       Relevant Orders   Lipid panel   Need for prophylactic vaccination against measles, mumps, rubella, varicella, and meningococcus       Relevant Orders   MENINGOCOCCAL MCV4O(MENVEO) (Completed)      Janene Madeira, MSN, NP-C [facility] [place] Pager: [phone] Office: [phone]  01/01/20  2:04 PM

## 2020-01-01 NOTE — Assessment & Plan Note (Signed)
Menveo booster today. Otherwise up to date.

## 2020-01-01 NOTE — Assessment & Plan Note (Signed)
Has been challenging to get her into see dental team with language barrier. Fortunately we were able to secure an appointment tomorrow at 3:00 pm for urgent evaluation for pain. She will bring her daughter to this appointment - will need a note for her to give to her school to excuse for 1/2 day.

## 2020-01-22 ENCOUNTER — Ambulatory Visit: Payer: Medicaid Other | Attending: Internal Medicine

## 2020-01-22 DIAGNOSIS — Z23 Encounter for immunization: Secondary | ICD-10-CM

## 2020-01-22 NOTE — Progress Notes (Signed)
   Covid-19 Vaccination Clinic  Name:  Marcia Long    MRN: [id number] DOB: 1981/02/10  01/22/2020  Marcia Long was observed post Covid-19 immunization for 15 minutes without incident. She was provided with Vaccine Information Sheet and instruction to access the V-Safe system.   Marcia Long was instructed to call 911 with any severe reactions post vaccine: Marland Kitchen Difficulty breathing  . Swelling of face and throat  . A fast heartbeat  . A bad rash all over body  . Dizziness and weakness

## 2020-03-19 DIAGNOSIS — R7309 Other abnormal glucose: Secondary | ICD-10-CM

## 2020-03-19 LAB — GLUCOSE, POCT (MANUAL RESULT ENTRY): POC Glucose: 102 mg/dl — AB (ref 70–99)

## 2020-03-19 NOTE — Congregational Nurse Program (Signed)
Client came in with c/o no periods for over a year.she states that tubal ligation was done with the birth of her last baby.  She has not followed up with GYN for pap smear or breast exam. I have called [facility] [phone]. They will call her back when schedule opens.She will need interpreter services. Nicole Cella Deedra Pro RN BSN PCCN  [phone]-office [phone]-Cell

## 2020-04-02 ENCOUNTER — Telehealth: Payer: Self-pay

## 2020-04-02 NOTE — Telephone Encounter (Signed)
I have called [facility] and scheduled appointment for July 14th @ 0815 am. I have called patient and informed her of the same.She has no questions or concerns. Nicole Cella Jaquita Bessire RN BNS

## 2020-04-02 NOTE — Telephone Encounter (Signed)
I have called [facility] and scheduled appointment for July 14th @ 08:15 am. Patient has been made aware. Address provided.  Louanne Belton RN BSN PCCN [phone]-cell [phone]-office.

## 2020-05-08 ENCOUNTER — Ambulatory Visit: Payer: Medicaid Other | Admitting: Nurse Practitioner

## 2020-05-14 ENCOUNTER — Other Ambulatory Visit: Payer: Self-pay | Admitting: Infectious Diseases

## 2020-06-05 ENCOUNTER — Other Ambulatory Visit: Payer: Self-pay

## 2020-06-05 ENCOUNTER — Ambulatory Visit (HOSPITAL_COMMUNITY)
Admission: EM | Admit: 2020-06-05 | Discharge: 2020-06-05 | Disposition: A | Discharge status: Home / Self Care | Payer: Medicaid Other | Attending: Urgent Care | Admitting: Urgent Care

## 2020-06-05 ENCOUNTER — Encounter (HOSPITAL_COMMUNITY): Payer: Self-pay

## 2020-06-05 DIAGNOSIS — M62830 Muscle spasm of back: Secondary | ICD-10-CM

## 2020-06-05 DIAGNOSIS — M25561 Pain in right knee: Secondary | ICD-10-CM

## 2020-06-05 DIAGNOSIS — M545 Low back pain, unspecified: Secondary | ICD-10-CM

## 2020-06-05 DIAGNOSIS — M25562 Pain in left knee: Secondary | ICD-10-CM

## 2020-06-05 DIAGNOSIS — M62838 Other muscle spasm: Secondary | ICD-10-CM

## 2020-06-05 MED ORDER — NAPROXEN 375 MG PO TABS: 375.0000 mg | ORAL_TABLET | Freq: Two times a day (BID) | ORAL | 0 refills | Status: DC

## 2020-06-05 MED ORDER — TIZANIDINE HCL 4 MG PO TABS: 4.0000 mg | ORAL_TABLET | Freq: Three times a day (TID) | ORAL | 0 refills | Status: DC | PRN

## 2020-06-05 NOTE — ED Triage Notes (Signed)
Patient is here with her daughter, Rama, with complaints of bilateral neck pain, bilateral lower back pain and right knee pain that began about a week ago. Patient states she has not fallen or injured herself in any way.

## 2020-06-05 NOTE — Discharge Instructions (Signed)
Naproxen is for back pain, joint pains, can use this twice per day with food. Tizanidine is for back spasms. Take this at bedtime. Hydrate well with at least 2 liters (64 ounces) of water daily.

## 2020-06-05 NOTE — ED Provider Notes (Signed)
[facility]   MRN: [id number] DOB: [birth year]  Subjective:   Marcia Long is a 39 y.o. female presenting for 1 week history multiple joint pains including her neck, low back, bilateral knees.  Patient states that she works in [employer], does not hydrate very well.  Has not tried oral medications for relief.  Denies falls, trauma, weakness, numbness or tingling, radiculopathy.  Patient does have a history of HIV, takes Descovy and Tivicay regularly.  Denies history of kidney disease.  No current facility-administered medications for this encounter.  Current Outpatient Medications:  .  DESCOVY 200-25 MG tablet, TAKE 1 TABLET BY MOUTH DAILY, Disp: 30 tablet, Rfl: 4 .  Elastic Bandages & Supports (COMFORT FIT MATERNITY SUPP MED) MISC, Wear daily when ambulating (Patient not taking: Reported on 03/31/2019), Disp: 1 each, Rfl: 0 .  TIVICAY 50 MG tablet, TAKE 1 TABLET(50 MG) BY MOUTH DAILY, Disp: 30 tablet, Rfl: 4   No Known Allergies  Past Medical History:  Diagnosis Date  . HIV (human immunodeficiency virus infection) ([place])   . HIV (human immunodeficiency virus infection) ([place]) 11/30/2018   Dx 2006 HLA neg Quantiferon neg Hep B sAg neg       Past Surgical History:  Procedure Laterality Date  . CESAREAN SECTION N/A 02/24/2019   Procedure: CESAREAN SECTION;  Surgeon: Aletha Halim, MD;  Location: [facility] LD [facility]S;  Service: Obstetrics;  Laterality: N/A;  . NO PAST SURGERIES      History reviewed. No pertinent family history.  Social History   Tobacco Use  . Smoking status: Never Smoker  . Smokeless tobacco: Never Used  Vaping Use  . Vaping Use: Never used  Substance Use Topics  . Alcohol use: Not Currently  . Drug use: Not Currently    ROS   Objective:   Vitals: BP 127/67 (BP Location: Right Arm)   Pulse (!) 51   Temp 98.3 F (36.8 C) (Oral)   Resp 12   SpO2 100%   Breastfeeding No   Physical Exam Constitutional:      General: She is not in acute  distress.    Appearance: Normal appearance. She is well-developed. She is not ill-appearing, toxic-appearing or diaphoretic.  HENT:     Head: Normocephalic and atraumatic.     Nose: Nose normal.     Mouth/Throat:     Mouth: Mucous membranes are moist.     Pharynx: Oropharynx is clear.  Eyes:     General: No scleral icterus.    Extraocular Movements: Extraocular movements intact.     Pupils: Pupils are equal, round, and reactive to light.  Cardiovascular:     Rate and Rhythm: Normal rate.  Pulmonary:     Effort: Pulmonary effort is normal.  Musculoskeletal:     Cervical back: Spasms (Along either side of his trapezius) and tenderness (Along either side of her trapezius) present. No swelling, edema, deformity, erythema, signs of trauma, lacerations, rigidity, torticollis, bony tenderness or crepitus. Pain with movement present. Normal range of motion.     Lumbar back: Spasms and tenderness (Along paraspinal muscles) present. No swelling, edema, deformity, signs of trauma, lacerations or bony tenderness. Normal range of motion. Negative right straight leg raise test and negative left straight leg raise test. No scoliosis.     Comments: Patient has full range of motion throughout.  Strength 5/5 for upper and lower extremities.  Skin:    General: Skin is warm and dry.  Neurological:     General: No focal  deficit present.     Mental Status: She is alert and oriented to person, place, and time.     Motor: No weakness.     Coordination: Coordination normal.     Gait: Gait normal.     Deep Tendon Reflexes: Reflexes normal.  Psychiatric:        Mood and Affect: Mood normal.        Behavior: Behavior normal.       Assessment and Plan :   PDMP not reviewed this encounter.  1. Acute bilateral low back pain without sciatica   2. Muscle spasm of back   3. Trapezius muscle spasm   4. Acute pain of both knees     Suspect musculoskeletal pain related to the strenuous nature of her work.   Recommended conservative management with naproxen, tizanidine.  Emphasized need to start hydrating very well with plain water.  Will defer imaging given excellent physical exam findings, stable vital signs. Counseled patient on potential for adverse effects with medications prescribed/recommended today, ER and return-to-clinic precautions discussed, patient verbalized understanding.    Jaynee Eagles, PA-C 06/05/20 1248

## 2020-07-02 ENCOUNTER — Other Ambulatory Visit: Payer: Medicaid Other

## 2020-07-03 ENCOUNTER — Other Ambulatory Visit: Payer: Medicaid Other

## 2020-07-03 ENCOUNTER — Other Ambulatory Visit: Payer: Self-pay

## 2020-07-03 DIAGNOSIS — Z113 Encounter for screening for infections with a predominantly sexual mode of transmission: Secondary | ICD-10-CM

## 2020-07-03 DIAGNOSIS — Z21 Asymptomatic human immunodeficiency virus [HIV] infection status: Secondary | ICD-10-CM

## 2020-07-03 DIAGNOSIS — Z79899 Other long term (current) drug therapy: Secondary | ICD-10-CM | POA: Diagnosis not present

## 2020-07-04 LAB — T-HELPER CELL (CD4) - (RCID CLINIC ONLY)
CD4 % Helper T Cell: 49 % (ref 33–65)
CD4 T Cell Abs: 1099 /uL (ref 400–1790)

## 2020-07-05 LAB — COMPREHENSIVE METABOLIC PANEL
AG Ratio: 1.5 (calc) (ref 1.0–2.5)
ALT: 20 U/L (ref 6–29)
AST: 15 U/L (ref 10–30)
Albumin: 4.3 g/dL (ref 3.6–5.1)
Alkaline phosphatase (APISO): 67 U/L (ref 31–125)
BUN: 17 mg/dL (ref 7–25)
CO2: 27 mmol/L (ref 20–32)
Calcium: 9.1 mg/dL (ref 8.6–10.2)
Chloride: 106 mmol/L (ref 98–110)
Creat: 0.98 mg/dL (ref 0.50–1.10)
Globulin: 2.8 g/dL (calc) (ref 1.9–3.7)
Glucose, Bld: 101 mg/dL — ABNORMAL HIGH (ref 65–99)
Potassium: 4.3 mmol/L (ref 3.5–5.3)
Sodium: 138 mmol/L (ref 135–146)
Total Bilirubin: 0.4 mg/dL (ref 0.2–1.2)
Total Protein: 7.1 g/dL (ref 6.1–8.1)

## 2020-07-05 LAB — CBC
HCT: 39.2 % (ref 35.0–45.0)
Hemoglobin: 13 g/dL (ref 11.7–15.5)
MCH: 30 pg (ref 27.0–33.0)
MCHC: 33.2 g/dL (ref 32.0–36.0)
MCV: 90.3 fL (ref 80.0–100.0)
MPV: 10.5 fL (ref 7.5–12.5)
Platelets: 290 10*3/uL (ref 140–400)
RBC: 4.34 10*6/uL (ref 3.80–5.10)
RDW: 12.8 % (ref 11.0–15.0)
WBC: 4.6 10*3/uL (ref 3.8–10.8)

## 2020-07-05 LAB — RPR: RPR Ser Ql: NONREACTIVE

## 2020-07-05 LAB — LIPID PANEL
Cholesterol: 175 mg/dL (ref <200)
HDL: 53 mg/dL (ref >=50)
LDL Cholesterol (Calc): 100 mg/dL (calc) — ABNORMAL HIGH
Non-HDL Cholesterol (Calc): 122 mg/dL (calc) (ref <130)
Total CHOL/HDL Ratio: 3.3 (calc) (ref <5.0)
Triglycerides: 123 mg/dL (ref <150)

## 2020-07-05 LAB — HIV-1 RNA QUANT-NO REFLEX-BLD
HIV 1 RNA Quant: <20 Copies/mL — ABNORMAL HIGH
HIV-1 RNA Quant, Log: <1.3 Log cps/mL — ABNORMAL HIGH

## 2020-07-17 ENCOUNTER — Encounter: Payer: Medicaid Other | Admitting: Infectious Diseases

## 2020-07-18 ENCOUNTER — Other Ambulatory Visit: Payer: Self-pay

## 2020-07-18 ENCOUNTER — Ambulatory Visit (INDEPENDENT_AMBULATORY_CARE_PROVIDER_SITE_OTHER): Payer: Medicaid Other | Admitting: Infectious Diseases

## 2020-07-18 VITALS — BP 114/76 | HR 56 | Temp 98.0°F | Wt 188.0 lb

## 2020-07-18 DIAGNOSIS — K089 Disorder of teeth and supporting structures, unspecified: Secondary | ICD-10-CM

## 2020-07-18 DIAGNOSIS — B20 Human immunodeficiency virus [HIV] disease: Secondary | ICD-10-CM

## 2020-07-18 DIAGNOSIS — Z23 Encounter for immunization: Secondary | ICD-10-CM | POA: Diagnosis not present

## 2020-07-18 DIAGNOSIS — G8929 Other chronic pain: Secondary | ICD-10-CM

## 2020-07-18 NOTE — Patient Instructions (Addendum)
Nice to see you today.   Please continue your Yellow and Blue pill once a day   Please schedule a follow up appointment in 4 months - we will do blood work 2 weeks before.  **30 minute appointment please   Will help you call [facility] @ [phone] to schedule a dental appointment

## 2020-07-18 NOTE — Assessment & Plan Note (Signed)
Facilitated dental scheduling today with interpretor present.

## 2020-07-18 NOTE — Assessment & Plan Note (Signed)
Her viral load remains fully suppressed and CD4 in normal range at nearly 1100. Will continue Tivicay and Descovy as she likes the smaller pills. Will have her back in 4 months.  Assisted with referral to primary care today and coordination of dental appointment.  No indication for STI testing Flu shot given today.  She has received COVID vaccine.  Pap smear at next appointment. Mammograms after 39 yo based on (-) family history.

## 2020-07-18 NOTE — Progress Notes (Signed)
Name: Marcia Long  DOB: Jan 08, 1981 MRN: [id number] PCP: Patient, No Pcp Per     Patient Active Problem List   Diagnosis Date Noted  . Human immunodeficiency virus (HIV) disease ([place]) 11/30/2018    Priority: High  . Elevated glucose level 08/14/2019  . Healthcare maintenance 04/23/2019  . Chronic dental pain 11/30/2018  . Language barrier 11/03/2018     Brief Narrative:  Marcia Long is a 39 y.o. female with HIV disease, dx 2006. Immigrated from [place] [phone] as refugee. Native language [place]. In care at [facility] previously. Married, husband HIV+.  History of OIs: unknown.  Previous Regimens: . Odefsey 2017 >> undetectable . Tivicay + Truvada during pregnancy 2019/2020 . Tivicay + Descovy 03-2019  Genotypes: . 2017 chart records indicate no major RT, PI, INSTI mutations  Subjective:  CC:  Routine follow-up care for HIV disease.  No concerns    HPI:  In person [place] interpretor present throughout the viist.  She has been doing very well. No concerns today. She continues to take her "yellow and blue pill" for HIV control. This is shipped monthly for her from the pharmacy. She has no trouble with side effects. No new medications or supplements. Lost about 10 lbs since our last visit together in March. Feels good overall. No more desire for children but not necessarily doing anything to prevent at this time aside from condoms infrequently.   Has not had flu shot yet and OK with getting today.  Unclear when she last had her pap smear nor what the test is for.   She still needs help with getting scheduled with a primary care provider and dentist. Unable to do these easily now that her daughter is in school.    Review of Systems  Constitutional: Negative for chills, fever, malaise/fatigue and weight loss.  HENT: Negative for sore throat.        Broken tooth / dental pain   Respiratory: Negative for cough and sputum production.    Cardiovascular: Negative for chest pain and leg swelling.  Gastrointestinal: Negative for abdominal pain, diarrhea and vomiting.  Genitourinary: Negative for dysuria and flank pain.  Musculoskeletal: Negative for joint pain, myalgias and neck pain.  Skin: Negative for rash.  Neurological: Negative for dizziness, tingling and headaches.  Psychiatric/Behavioral: Negative for depression and substance abuse. The patient is not nervous/anxious and does not have insomnia.     Past Medical History:  Diagnosis Date  . HIV (human immunodeficiency virus infection) ([place])   . HIV (human immunodeficiency virus infection) ([place]) 11/30/2018   Dx 2006 HLA neg Quantiferon neg Hep B sAg neg      Outpatient Medications Prior to Visit  Medication Sig Dispense Refill  . DESCOVY 200-25 MG tablet TAKE 1 TABLET BY MOUTH DAILY 30 tablet 4  . TIVICAY 50 MG tablet TAKE 1 TABLET(50 MG) BY MOUTH DAILY 30 tablet 4  . Elastic Bandages & Supports (COMFORT FIT MATERNITY SUPP MED) MISC Wear daily when ambulating (Patient not taking: Reported on 03/31/2019) 1 each 0  . naproxen (NAPROSYN) 375 MG tablet Take 1 tablet (375 mg total) by mouth 2 (two) times daily with a meal. (Patient not taking: Reported on 07/18/2020) 30 tablet 0  . tiZANidine (ZANAFLEX) 4 MG tablet Take 1 tablet (4 mg total) by mouth every 8 (eight) hours as needed. (Patient not taking: Reported on 07/18/2020) 30 tablet 0   No facility-administered medications prior to visit.     No  Known Allergies  Social History   Tobacco Use  . Smoking status: Never Smoker  . Smokeless tobacco: Never Used  Vaping Use  . Vaping Use: Never used  Substance Use Topics  . Alcohol use: Not Currently  . Drug use: Not Currently    Social History   Substance and Sexual Activity  Sexual Activity Yes  . Birth control/protection: None     Objective:   Vitals:   07/18/20 1026  BP: 114/76  Pulse: (!) 56  Temp: 98 F (36.7 C)  TempSrc: Oral  SpO2: 100%   Weight: 188 lb (85.3 kg)   Body mass index is 30.81 kg/m.   Physical Exam Constitutional:      Appearance: She is well-developed.     Comments: Seated comfortably in chair.   HENT:     Mouth/Throat:     Mouth: No oral lesions.     Dentition: Normal dentition. No dental abscesses.     Pharynx: No oropharyngeal exudate.  Cardiovascular:     Rate and Rhythm: Normal rate and regular rhythm.     Heart sounds: Normal heart sounds.  Pulmonary:     Effort: Pulmonary effort is normal.     Breath sounds: Normal breath sounds.  Abdominal:     General: There is no distension.     Palpations: Abdomen is soft.     Tenderness: There is no abdominal tenderness.  Lymphadenopathy:     Cervical: No cervical adenopathy.  Skin:    General: Skin is warm and dry.     Findings: No rash.  Neurological:     Mental Status: She is alert and oriented to person, place, and time.  Psychiatric:        Judgment: Judgment normal.     Comments: In good spirits today.     Lab Results Lab Results  Component Value Date   WBC 4.6 07/03/2020   HGB 13.0 07/03/2020   HCT 39.2 07/03/2020   MCV 90.3 07/03/2020   PLT 290 07/03/2020    Lab Results  Component Value Date   CREATININE 0.98 07/03/2020   BUN 17 07/03/2020   NA 138 07/03/2020   K 4.3 07/03/2020   CL 106 07/03/2020   CO2 27 07/03/2020    Lab Results  Component Value Date   ALT 20 07/03/2020   AST 15 07/03/2020   BILITOT 0.4 07/03/2020    Lab Results  Component Value Date   CHOL 175 07/03/2020   HDL 53 07/03/2020   LDLCALC 100 (H) 07/03/2020   TRIG 123 07/03/2020   CHOLHDL 3.3 07/03/2020   HIV 1 RNA Quant  Date Value  07/03/2020 <20 Copies/mL (H)  12/08/2019 <20 DETECTED copies/mL (A)  07/31/2019 <20 NOT DETECTED copies/mL   CD4 T Cell Abs (/uL)  Date Value  07/03/2020 1,099  12/08/2019 840  07/31/2019 813     Assessment & Plan:   Problem List Items Addressed This Visit      High   Human immunodeficiency virus  (HIV) disease ([place]) - Primary (Chronic)    Her viral load remains fully suppressed and CD4 in normal range at nearly 1100. Will continue Tivicay and Descovy as she likes the smaller pills. Will have her back in 4 months.  Assisted with referral to primary care today and coordination of dental appointment.  No indication for STI testing Flu shot given today.  She has received COVID vaccine.  Pap smear at next appointment. Mammograms after 38 yo based on (-) family history.  Relevant Orders   HIV-1 RNA quant-no reflex-bld   T-helper cell (CD4)- ([facility] only)   CBC with Differential/Platelet   COMPLETE METABOLIC PANEL WITH GFR   RPR   Lipid panel     Unprioritized   Chronic dental pain    Facilitated dental scheduling today with interpretor present.        Other Visit Diagnoses    Need for immunization against influenza       Relevant Orders   Flu Vaccine QUAD 36+ mos IM (Completed)      Janene Madeira, MSN, NP-C [facility] for [place] Pager: [phone] Office: [phone]  07/18/20  10:01 PM

## 2020-08-22 ENCOUNTER — Encounter: Payer: Medicaid Other | Admitting: Internal Medicine

## 2020-10-15 ENCOUNTER — Ambulatory Visit: Payer: Medicaid Other | Attending: Critical Care Medicine

## 2020-10-15 ENCOUNTER — Ambulatory Visit: Payer: Medicaid Other

## 2020-10-15 DIAGNOSIS — Z23 Encounter for immunization: Secondary | ICD-10-CM

## 2020-10-15 NOTE — Progress Notes (Signed)
Moderna booster administered to L deltoid, pt tolerated well and waited required 15 mins w/o adverse reaction noted, Vis given

## 2020-10-23 ENCOUNTER — Other Ambulatory Visit: Payer: Self-pay | Admitting: Infectious Diseases

## 2020-11-19 ENCOUNTER — Other Ambulatory Visit: Payer: Self-pay

## 2020-11-19 ENCOUNTER — Other Ambulatory Visit: Payer: Medicaid Other

## 2020-11-19 DIAGNOSIS — Z79899 Other long term (current) drug therapy: Secondary | ICD-10-CM | POA: Diagnosis not present

## 2020-11-19 DIAGNOSIS — B20 Human immunodeficiency virus [HIV] disease: Secondary | ICD-10-CM | POA: Diagnosis not present

## 2020-11-20 LAB — T-HELPER CELL (CD4) - (RCID CLINIC ONLY)
CD4 % Helper T Cell: 49 % (ref 33–65)
CD4 T Cell Abs: 815 /uL (ref 400–1790)

## 2020-11-21 LAB — CBC WITH DIFFERENTIAL/PLATELET
Absolute Monocytes: 414 cells/uL (ref 200–950)
Basophils Absolute: 29 cells/uL (ref 0–200)
Basophils Relative: 0.8 %
Eosinophils Absolute: 324 cells/uL (ref 15–500)
Eosinophils Relative: 9 %
HCT: 36.2 % (ref 35.0–45.0)
Hemoglobin: 12.2 g/dL (ref 11.7–15.5)
Lymphs Abs: 1789 cells/uL (ref 850–3900)
MCH: 29.4 pg (ref 27.0–33.0)
MCHC: 33.7 g/dL (ref 32.0–36.0)
MCV: 87.2 fL (ref 80.0–100.0)
MPV: 10.1 fL (ref 7.5–12.5)
Monocytes Relative: 11.5 %
Neutro Abs: 1044 cells/uL — ABNORMAL LOW (ref 1500–7800)
Neutrophils Relative %: 29 %
Platelets: 255 10*3/uL (ref 140–400)
RBC: 4.15 10*6/uL (ref 3.80–5.10)
RDW: 12.5 % (ref 11.0–15.0)
Total Lymphocyte: 49.7 %
WBC: 3.6 10*3/uL — ABNORMAL LOW (ref 3.8–10.8)

## 2020-11-21 LAB — LIPID PANEL
Cholesterol: 145 mg/dL (ref <200)
HDL: 57 mg/dL (ref >=50)
LDL Cholesterol (Calc): 73 mg/dL (calc)
Non-HDL Cholesterol (Calc): 88 mg/dL (calc) (ref <130)
Total CHOL/HDL Ratio: 2.5 (calc) (ref <5.0)
Triglycerides: 69 mg/dL (ref <150)

## 2020-11-21 LAB — COMPLETE METABOLIC PANEL WITH GFR
AG Ratio: 1.5 (calc) (ref 1.0–2.5)
ALT: 27 U/L (ref 6–29)
AST: 20 U/L (ref 10–30)
Albumin: 3.8 g/dL (ref 3.6–5.1)
Alkaline phosphatase (APISO): 49 U/L (ref 31–125)
BUN: 10 mg/dL (ref 7–25)
CO2: 25 mmol/L (ref 20–32)
Calcium: 8.3 mg/dL — ABNORMAL LOW (ref 8.6–10.2)
Chloride: 106 mmol/L (ref 98–110)
Creat: 0.7 mg/dL (ref 0.50–1.10)
GFR, Est African American: 126 mL/min/{1.73_m2} (ref >=60)
GFR, Est Non African American: 108 mL/min/{1.73_m2} (ref >=60)
Globulin: 2.6 g/dL (calc) (ref 1.9–3.7)
Glucose, Bld: 92 mg/dL (ref 65–99)
Potassium: 4.2 mmol/L (ref 3.5–5.3)
Sodium: 138 mmol/L (ref 135–146)
Total Bilirubin: 0.3 mg/dL (ref 0.2–1.2)
Total Protein: 6.4 g/dL (ref 6.1–8.1)

## 2020-11-21 LAB — RPR: RPR Ser Ql: NONREACTIVE

## 2020-11-21 LAB — HIV-1 RNA QUANT-NO REFLEX-BLD
HIV 1 RNA Quant: <20 Copies/mL — ABNORMAL HIGH
HIV-1 RNA Quant, Log: <1.3 Log cps/mL — ABNORMAL HIGH

## 2020-12-03 ENCOUNTER — Telehealth: Payer: Self-pay

## 2020-12-03 ENCOUNTER — Ambulatory Visit: Payer: Medicaid Other | Admitting: Infectious Diseases

## 2020-12-03 NOTE — Telephone Encounter (Signed)
Social Worker from [employer] called to share information related to patient's medication.  Malachi Bonds has been calling the pharmacy and giving verification of  her address monthly for the last year.  Kylin accompanies a [facility] patient for each visit. Malachi Bonds will not longer be working at [facility]  and will not be able to continue to fill the patients medication.    Malachi Bonds states she has been helping because the patient stated there is a language barrier. Patient reported when the pharmacy calls she does answer her phone because she was told they could not understand anything she was saying.    Ida wanted to inform our office of this issue since she will need assistance with refilling her medications from this point on.

## 2020-12-12 ENCOUNTER — Other Ambulatory Visit: Payer: Self-pay

## 2020-12-12 ENCOUNTER — Ambulatory Visit (INDEPENDENT_AMBULATORY_CARE_PROVIDER_SITE_OTHER): Payer: Medicaid Other | Admitting: Infectious Diseases

## 2020-12-12 ENCOUNTER — Other Ambulatory Visit (HOSPITAL_COMMUNITY)
Admission: RE | Admit: 2020-12-12 | Discharge: 2020-12-12 | Disposition: A | Discharge status: Home / Self Care | Payer: Medicaid Other | Source: Ambulatory Visit | Attending: Infectious Diseases | Admitting: Infectious Diseases

## 2020-12-12 ENCOUNTER — Encounter: Payer: Self-pay | Admitting: Infectious Diseases

## 2020-12-12 VITALS — BP 120/74 | HR 48 | Temp 97.8°F | Wt 189.0 lb

## 2020-12-12 DIAGNOSIS — Z124 Encounter for screening for malignant neoplasm of cervix: Secondary | ICD-10-CM | POA: Insufficient documentation

## 2020-12-12 DIAGNOSIS — R7309 Other abnormal glucose: Secondary | ICD-10-CM

## 2020-12-12 DIAGNOSIS — B351 Tinea unguium: Secondary | ICD-10-CM

## 2020-12-12 DIAGNOSIS — Z789 Other specified health status: Secondary | ICD-10-CM

## 2020-12-12 DIAGNOSIS — L03011 Cellulitis of right finger: Secondary | ICD-10-CM

## 2020-12-12 DIAGNOSIS — Z21 Asymptomatic human immunodeficiency virus [HIV] infection status: Secondary | ICD-10-CM

## 2020-12-12 DIAGNOSIS — B20 Human immunodeficiency virus [HIV] disease: Secondary | ICD-10-CM | POA: Diagnosis not present

## 2020-12-12 DIAGNOSIS — Z0000 Encounter for general adult medical examination without abnormal findings: Secondary | ICD-10-CM | POA: Diagnosis not present

## 2020-12-12 DIAGNOSIS — Z113 Encounter for screening for infections with a predominantly sexual mode of transmission: Secondary | ICD-10-CM

## 2020-12-12 DIAGNOSIS — Z603 Acculturation difficulty: Secondary | ICD-10-CM

## 2020-12-12 MED ORDER — TERBINAFINE HCL 250 MG PO TABS: 250.0000 mg | ORAL_TABLET | Freq: Every day | ORAL | 0 refills | Status: AC

## 2020-12-12 MED ORDER — AMOXICILLIN-POT CLAVULANATE 875-125 MG PO TABS: 1.0000 | ORAL_TABLET | Freq: Two times a day (BID) | ORAL | 0 refills | Status: DC

## 2020-12-12 MED ORDER — TERBINAFINE HCL 250 MG PO TABS: 250.0000 mg | ORAL_TABLET | Freq: Every day | ORAL | 0 refills | Status: DC

## 2020-12-12 MED ORDER — AMOXICILLIN-POT CLAVULANATE 875-125 MG PO TABS: 1.0000 | ORAL_TABLET | Freq: Two times a day (BID) | ORAL | 0 refills | Status: AC

## 2020-12-12 NOTE — Assessment & Plan Note (Addendum)
Unfortunately we are unable to get a [place] interpretor in person, on [place] or with [employer] until later today.  We were able to get in a little discussion today to where I understand she has had problems securing her medication. Will try to call [facility] to discuss options to where we can help ensure the call is placed to her prior to shipment either on a weekend if possible, or after 5:30 pm when her daughter is home from school.   Her VL was undetectable with recent labs which she seemed to understand. Will see her back in 4 months with labs prior

## 2020-12-12 NOTE — Progress Notes (Signed)
Name: Marcia Long  DOB: 13-Aug-1981 MRN: [id number] PCP: Patient, No Pcp Per      Brief Narrative:  Marcia Long is a 40 y.o. female with HIV disease, dx 2006. Immigrated from [id number] as refugee. Native language [place]. In care at [facility] previously. Married, husband HIV+.  History of OIs: unknown.  Previous Regimens: . Odefsey 2017 >> undetectable . Tivicay + Truvada during pregnancy 2019/2020 . Tivicay + Descovy 03-2019  Genotypes: . 2017 chart records indicate no major RT, PI, INSTI mutations  Subjective:   Chief Complaint  Patient presents with  . Follow-up    B20 and pap      HPI:  No interpretor available for the visit.   She is able to say she is doing well overall. Upset that her medications have not come to her routinely. I have looked at the documentation provided by CSW team at [facility], where her daughter is currently in care with Peds ID.   Continued to lose some weight - down another 8 lbs since last office visit. Baby boy is almost 77 year old. Waking up frequently throughout the night  Finger pain on the right ring finger - discolored and misshaped nail now for over 4 months. Has had worsening pain around the nailbed. No purulent drainage noted. Swollen and tender. Has not tried anything for this.     Review of Systems  Constitutional: Negative for chills, fever, malaise/fatigue and weight loss.  Respiratory: Negative for cough, sputum production and shortness of breath.   Cardiovascular: Negative.   Gastrointestinal: Negative for abdominal pain, diarrhea and vomiting.  Musculoskeletal: Negative for joint pain, myalgias and neck pain.  Skin: Positive for rash (finger and nail pain as described above).  Neurological: Negative for headaches.  Psychiatric/Behavioral: Negative for depression and substance abuse. The patient is not nervous/anxious.     Past Medical History:  Diagnosis Date  . HIV (human  immunodeficiency virus infection) ([place])   . HIV (human immunodeficiency virus infection) ([place]) 11/30/2018   Dx 2006 HLA neg Quantiferon neg Hep B sAg neg      Outpatient Medications Prior to Visit  Medication Sig Dispense Refill  . DESCOVY 200-25 MG tablet TAKE 1 TABLET BY MOUTH DAILY 30 tablet 1  . Elastic Bandages & Supports (COMFORT FIT MATERNITY SUPP MED) MISC Wear daily when ambulating 1 each 0  . naproxen (NAPROSYN) 375 MG tablet Take 1 tablet (375 mg total) by mouth 2 (two) times daily with a meal. 30 tablet 0  . TIVICAY 50 MG tablet TAKE 1 TABLET(50 MG) BY MOUTH DAILY 30 tablet 1  . tiZANidine (ZANAFLEX) 4 MG tablet Take 1 tablet (4 mg total) by mouth every 8 (eight) hours as needed. 30 tablet 0   No facility-administered medications prior to visit.     No Known Allergies  Social History   Tobacco Use  . Smoking status: Never Smoker  . Smokeless tobacco: Never Used  Vaping Use  . Vaping Use: Never used  Substance Use Topics  . Alcohol use: Not Currently  . Drug use: Not Currently    Social History   Substance and Sexual Activity  Sexual Activity Yes  . Birth control/protection: None     Objective:   Vitals:   12/12/20 0851  BP: 120/74  Pulse: (!) 48  Temp: 97.8 F (36.6 C)  TempSrc: Oral  Weight: 189 lb (85.7 kg)   Body mass index is 30.97 kg/m.  Physical Exam Constitutional:      Appearance: Normal appearance. She is not ill-appearing.  HENT:     Mouth/Throat:     Mouth: Mucous membranes are moist.     Pharynx: Oropharynx is clear.  Eyes:     General: No scleral icterus. Pulmonary:     Effort: Pulmonary effort is normal.  Genitourinary:    General: Normal vulva.     Vagina: Normal.     Cervix: No discharge or cervical bleeding.     Comments: Cervix difficult to visualize despite attempts with pelvic tilt and positioning.  Musculoskeletal:     Right hand: Deformity and tenderness present.     Comments: R ring finger with discolored and  deformed nail and nailbed. Surrounding fullness and tenderness to the nailbed without warmth or purulence. Very dry skin.   Neurological:     Mental Status: She is oriented to person, place, and time.  Psychiatric:        Mood and Affect: Mood normal.        Thought Content: Thought content normal.      Lab Results Lab Results  Component Value Date   WBC 3.6 (L) 11/19/2020   HGB 12.2 11/19/2020   HCT 36.2 11/19/2020   MCV 87.2 11/19/2020   PLT 255 11/19/2020    Lab Results  Component Value Date   CREATININE 0.70 11/19/2020   BUN 10 11/19/2020   NA 138 11/19/2020   K 4.2 11/19/2020   CL 106 11/19/2020   CO2 25 11/19/2020    Lab Results  Component Value Date   ALT 27 11/19/2020   AST 20 11/19/2020   BILITOT 0.3 11/19/2020    Lab Results  Component Value Date   CHOL 145 11/19/2020   HDL 57 11/19/2020   LDLCALC 73 11/19/2020   TRIG 69 11/19/2020   CHOLHDL 2.5 11/19/2020   HIV 1 RNA Quant  Date Value  11/19/2020 <20 Copies/mL (H)  07/03/2020 <20 Copies/mL (H)  12/08/2019 <20 DETECTED copies/mL (A)   CD4 T Cell Abs (/uL)  Date Value  11/19/2020 815  07/03/2020 1,099  12/08/2019 840     Assessment & Plan:   Problem List Items Addressed This Visit      High   Human immunodeficiency virus (HIV) disease ([place]) (Chronic)    Unfortunately we are unable to get a [place] interpretor in person, on [place] or with [place] [facility] until later today.  We were able to get in a little discussion today to where I understand she has had problems securing her medication. Will try to call [place] to discuss options to where we can help ensure the call is placed to her prior to shipment either on a weekend if possible, or after 5:30 pm when her daughter is home from school.   Her VL was undetectable with recent labs which she seemed to understand. Will see her back in 4 months with labs prior       Relevant Medications   terbinafine (LAMISIL) 250 MG  tablet     Unprioritized   Screening for cervical cancer    Difficulty finding cervix with limited ability to provide direction for positioning.  Normal pelvic exam. Cervical brushing collected for cytology and HPV.        Relevant Orders   Cytology - PAP( [place])   Paronychia of right ring finger    Chronic fungal infection of nail with what looks like secondary bacterial paronychia. Will give augmentin BID x  7d. Epsome salt and warm water soaks a for 15 minutes 4 times a day if she can.  Will also treat underlying fungal infection of her nail with 6 weeks of oral terbinafine   CARE INSTRUCTIONS: Wash the area with clean water 2 times a day. Don't use hydrogen peroxide or alcohol, which can slow healing. Soak the area in warm water twice a day for 15 minutes each time. After soaking, dry the area well and apply a thin layer of petroleum jelly, such as Vaseline. Put on a new bandage.      Relevant Medications   amoxicillin-clavulanate (AUGMENTIN) 875-125 MG tablet   Language barrier    Not available at the time of our visit. Will try to call back with interpretor later with care instructions for finger or after her daughter is home to help translate.       Healthcare maintenance    Blood pressure looks good. All vitals stable. Pre-diabetic in the past. With weight loss will recheck A1C next lab check.   Has had COVID vaccine, flu shot and other recommended vaccines for [facility].       Fungal infection of nail    Will try terbinafine PO daily x 6 weeks.       Relevant Medications   terbinafine (LAMISIL) 250 MG tablet   Elevated glucose level   Relevant Orders   Hemoglobin A1c   Asymptomatic HIV infection ([place]) - Primary   Relevant Medications   terbinafine (LAMISIL) 250 MG tablet   Other Relevant Orders   HIV-1 RNA quant-no reflex-bld    Other Visit Diagnoses    Routine screening for STI (sexually transmitted infection)       Relevant Orders   Cervicovaginal  ancillary only( [place])      Janene Madeira, MSN, NP-C [place] [facility] Pager: [phone] Office: [phone]  12/12/20  3:14 PM

## 2020-12-12 NOTE — Assessment & Plan Note (Addendum)
Chronic fungal infection of nail with what looks like secondary bacterial paronychia. Will give augmentin BID x 7d. Epsome salt and warm water soaks a for 15 minutes 4 times a day if she can.  Will also treat underlying fungal infection of her nail with 6 weeks of oral terbinafine   CARE INSTRUCTIONS: Wash the area with clean water 2 times a day. Don't use hydrogen peroxide or alcohol, which can slow healing. Soak the area in warm water twice a day for 15 minutes each time. After soaking, dry the area well and apply a thin layer of petroleum jelly, such as Vaseline. Put on a new bandage.

## 2020-12-12 NOTE — Assessment & Plan Note (Signed)
Blood pressure looks good. All vitals stable. Pre-diabetic in the past. With weight loss will recheck A1C next lab check.   Has had COVID vaccine, flu shot and other recommended vaccines for [facility].

## 2020-12-12 NOTE — Assessment & Plan Note (Signed)
Not available at the time of our visit. Will try to call back with interpretor later with care instructions for finger or after her daughter is home to help translate.

## 2020-12-12 NOTE — Assessment & Plan Note (Signed)
Difficulty finding cervix with limited ability to provide direction for positioning.  Normal pelvic exam. Cervical brushing collected for cytology and HPV.

## 2020-12-12 NOTE — Assessment & Plan Note (Addendum)
Will try terbinafine PO daily x 6 weeks.

## 2020-12-12 NOTE — Patient Instructions (Addendum)
Will call you with an interpretor when your tests come back today.   Your blood work looks very good.   We will call the pharmacy to see if they can coordinate a call to you when your daughter is home from school.   I would like to see you back in 4 months to check in again with labs before.   You have a fungal infection of your finger nail. I also suspect you have a bacterial infection  For your finger please soak in betadine and warm water   CARE INSTRUCTIONS: Wash the area with clean water 2 times a day. Don't use hydrogen peroxide or alcohol, which can slow healing. Soak the area in warm water twice a day for 15 minutes each time. After soaking, dry the area well and apply a thin layer of petroleum jelly, such as Vaseline. Put on a new bandage.

## 2020-12-12 NOTE — Progress Notes (Addendum)
I tried to get [facility] interpreter on interpreter line and was advised there was no [place] interpreter available. I called to speak with patient and she asked that I speak to her daughter regarding her medication, so her daughter could translate. I advised patient's daughter that 2 medications have been sent to pharmacy for the patient's fingernail, how to care for the nail and patient's daughter translated the information to the patient.  Patient verbalized understanding and requested medications for her fingernail be sent to local [facility].  I have also spoke to [facility] regarding patient having difficulty getting medications shipped to her due to language barrier.  I was advised by [facility] that the pharmacist had noted the patient's file to use interpreter line for [place] to confirm delivery for medications and if [facility] interpreter is unavailable call our to try to help connect with the patient to get medication delivered.  IDiminique T Pricilla Loveless

## 2020-12-13 LAB — CERVICOVAGINAL ANCILLARY ONLY
Chlamydia: NEGATIVE
Comment: NEGATIVE
Comment: NORMAL
Neisseria Gonorrhea: NEGATIVE

## 2020-12-16 LAB — CYTOLOGY - PAP
Adequacy: ABSENT
Diagnosis: NEGATIVE

## 2020-12-31 ENCOUNTER — Other Ambulatory Visit: Payer: Self-pay

## 2020-12-31 MED ORDER — DESCOVY 200-25 MG PO TABS: 1.0000 | ORAL_TABLET | Freq: Every day | ORAL | 5 refills | Status: DC

## 2020-12-31 MED ORDER — TIVICAY 50 MG PO TABS: ORAL_TABLET | ORAL | 5 refills | Status: DC

## 2021-01-29 ENCOUNTER — Telehealth: Payer: Self-pay

## 2021-01-29 NOTE — Telephone Encounter (Signed)
Received call from specialty pharmacy stating that patient gave pharmacy permission to contact [facility] to schedule patient's medication. Their documentation shows that they are to call us to schedule tivicay and descovy delivery. Per chart, Diminique spoke with [facility] pharm team that they are to attempt to use [place] translator to arrange scheduling of medications first and if that is not possible, we are to be contacted to contact translator services associated with [facility]. Updated Pharmacy who will try their services first and reach out if unable to do so.   Bradden Tadros Loyola Mast, RN

## 2021-02-20 ENCOUNTER — Telehealth: Payer: Self-pay

## 2021-02-20 NOTE — Telephone Encounter (Signed)
Received notice from [facility] that they do not have a [place] interpreter to assist with scheduling medication delivery. They called to arrange delivery. Attempted to call patient to address but unable to reach her and daughter is in school. RN scheduled delivery for 5/3 at address on file; answered questions to best of ability. [facility] is not able to set up delivery ahead of time.   Bionca Mckey Loyola Mast, RN

## 2021-02-20 NOTE — Telephone Encounter (Signed)
Yes we have not been able to make much headway with this and has been ongoing problem (as well as other things). Not sure what the long term solution is going to be. This will also apply for her husband's medication.

## 2021-02-27 ENCOUNTER — Encounter (HOSPITAL_COMMUNITY): Payer: Self-pay

## 2021-02-27 ENCOUNTER — Ambulatory Visit (HOSPITAL_COMMUNITY)
Admission: EM | Admit: 2021-02-27 | Discharge: 2021-02-27 | Disposition: A | Discharge status: Home / Self Care | Payer: Medicaid Other | Attending: Internal Medicine | Admitting: Internal Medicine

## 2021-02-27 ENCOUNTER — Other Ambulatory Visit: Payer: Self-pay

## 2021-02-27 DIAGNOSIS — M1712 Unilateral primary osteoarthritis, left knee: Secondary | ICD-10-CM | POA: Diagnosis not present

## 2021-02-27 DIAGNOSIS — L600 Ingrowing nail: Secondary | ICD-10-CM | POA: Diagnosis not present

## 2021-02-27 MED ORDER — DICLOFENAC SODIUM 1 % EX GEL: 4.0000 g | Freq: Four times a day (QID) | CUTANEOUS | 0 refills | Status: DC

## 2021-02-27 MED ORDER — ACETAMINOPHEN 325 MG PO TABS: 650.0000 mg | ORAL_TABLET | Freq: Four times a day (QID) | ORAL | Status: AC | PRN

## 2021-02-27 MED ORDER — BACITRACIN ZINC 500 UNIT/GM EX OINT: 1.0000 "application " | TOPICAL_OINTMENT | Freq: Two times a day (BID) | CUTANEOUS | 0 refills | Status: DC

## 2021-02-27 NOTE — ED Notes (Signed)
[place] interpreter not available on phone line or Stratus. Pt notified and gave verbal permission for daughter to be utilized for interpretation for triage.

## 2021-02-27 NOTE — Discharge Instructions (Signed)
Use medications as prescribed Avoid keeping your finger in water for prolonged periods of time I will advise that you wear gloves when cooking Return to urgent care if you have any worsening symptoms.

## 2021-02-27 NOTE — ED Triage Notes (Addendum)
Pt reports right hand ring finger pain and swelling which worsens when hand is wet. Pt also reports bilateral leg pain, worst in knees, for one year. Pt reports having HIV and taking HIV medication but does not know the name.

## 2021-03-02 NOTE — ED Provider Notes (Addendum)
[facility]    CSN: [id number] Arrival date & time: 02/27/21  0924      History   Chief Complaint Chief Complaint  Patient presents with  . Hand Problem  . Leg Pain    HPI Marcia Long is a 40 y.o. female with a history of HIV on antiretroviral agents comes to the urgent care with complaints of right little finger pain and swelling.  Her symptoms have been recurrent and seems to be aggravated when she gets her hands when asked.  No discharge.  Symptoms have been ongoing in a recurrent fashion over the past year.  She also complains of recurrent pain bilaterally.  No trauma or falls.  No known aggravating or relieving factors.  HPI  Past Medical History:  Diagnosis Date  . HIV (human immunodeficiency virus infection) ([place])   . HIV (human immunodeficiency virus infection) ([place]) 11/30/2018   Dx 2006 HLA neg Quantiferon neg Hep B sAg neg      Patient Active Problem List   Diagnosis Date Noted  . Fungal infection of nail 12/12/2020  . Paronychia of right ring finger 12/12/2020  . Screening for cervical cancer 12/12/2020  . Asymptomatic HIV infection ([place]) 12/12/2020  . Elevated glucose level 08/14/2019  . Healthcare maintenance 04/23/2019  . Human immunodeficiency virus (HIV) disease ([place]) 11/30/2018  . Chronic dental pain 11/30/2018  . Language barrier 11/03/2018    Past Surgical History:  Procedure Laterality Date  . CESAREAN SECTION N/A 02/24/2019   Procedure: CESAREAN SECTION;  Surgeon: Aletha Halim, MD;  Location: [facility] LD ORS;  Service: Obstetrics;  Laterality: N/A;  . NO PAST SURGERIES      OB History    Gravida  6   Para  6   Term  6   Preterm  0   AB  0   Living  6     SAB  0   IAB  0   Ectopic  0   Multiple  0   Live Births  6            Home Medications    Prior to Admission medications   Medication Sig Start Date End Date Taking? Authorizing Provider  acetaminophen (TYLENOL) 325 MG tablet Take 2 tablets (650 mg total)  by mouth every 6 (six) hours as needed. 02/27/21  Yes Amarissa Koerner, Myrene Galas, MD  bacitracin ointment Apply 1 application topically 2 (two) times daily. 02/27/21  Yes Vietta Bonifield, Myrene Galas, MD  diclofenac Sodium (VOLTAREN) 1 % GEL Apply 4 g topically 4 (four) times daily. 02/27/21  Yes Emon Miggins, Myrene Galas, MD  dolutegravir (TIVICAY) 50 MG tablet TAKE 1 TABLET(50 MG) BY MOUTH DAILY 12/31/20   Nunam Iqua Callas, NP  Elastic Bandages & Supports (COMFORT FIT MATERNITY SUPP MED) MISC Wear daily when ambulating 12/01/18   Constant, Peggy, MD  emtricitabine-tenofovir AF (DESCOVY) 200-25 MG tablet Take 1 tablet by mouth daily. 12/31/20   Buford Callas, NP  tiZANidine (ZANAFLEX) 4 MG tablet Take 1 tablet (4 mg total) by mouth every 8 (eight) hours as needed. 06/05/20   Jaynee Eagles, PA-C    Family History History reviewed. No pertinent family history.  Social History Social History   Tobacco Use  . Smoking status: Never Smoker  . Smokeless tobacco: Never Used  Vaping Use  . Vaping Use: Never used  Substance Use Topics  . Alcohol use: Not Currently  . Drug use: Not Currently     Allergies   Patient has no  known allergies.   Review of Systems Review of Systems  Musculoskeletal: Positive for arthralgias. Negative for gait problem, joint swelling and myalgias.  Neurological: Negative.      Physical Exam Triage Vital Signs ED Triage Vitals  Enc Vitals Group     BP 02/27/21 1017 125/82     Pulse Rate 02/27/21 1017 (!) 55     Resp 02/27/21 1017 18     Temp 02/27/21 1017 97.7 F (36.5 C)     Temp src --      SpO2 02/27/21 1017 97 %     Weight --      Height --      Head Circumference --      Peak Flow --      Pain Score 02/27/21 1011 8     Pain Loc --      Pain Edu? --      Excl. in [place]? --    No data found.  Updated Vital Signs BP 125/82   Pulse (!) 55   Temp 97.7 F (36.5 C)   Resp 18   LMP 02/22/2021   SpO2 97%   Visual Acuity Right Eye Distance:   Left Eye Distance:   Bilateral  Distance:    Right Eye Near:   Left Eye Near:    Bilateral Near:     Physical Exam Vitals and nursing note reviewed.  Constitutional:      General: She is not in acute distress.    Appearance: She is not ill-appearing.  Cardiovascular:     Rate and Rhythm: Normal rate and regular rhythm.     Pulses: Normal pulses.     Heart sounds: Normal heart sounds.  Pulmonary:     Effort: Pulmonary effort is normal.     Breath sounds: Normal breath sounds.  Abdominal:     General: Abdomen is flat. Bowel sounds are normal.  Musculoskeletal:        General: Normal range of motion.     Comments: Taronychia of the right middle finger nailbed with no purulence or erythema.  Skin:    Capillary Refill: Capillary refill takes less than 2 seconds.  Neurological:     General: No focal deficit present.     Mental Status: She is alert and oriented to person, place, and time.      UC Treatments / Results  Labs (all labs ordered are listed, but only abnormal results are displayed) Labs Reviewed - No data to display  EKG   Radiology No results found.  Procedures Procedures (including critical care time)  Medications Ordered in UC Medications - No data to display  Initial Impression / Assessment and Plan / UC Course  I have reviewed the triage vital signs and the nursing notes.  Pertinent labs & imaging results that were available during my care of the patient were reviewed by me and considered in my medical decision making (see chart for details).   1. Trachyonychia of right middle finger nail with no signs of infection or purulence: Patient is advised to the scale.  Patient is advised to avoid getting the fingers wet for long periods of time.  If she is going to cook she is advised to use gloves.  Antibiotic ointments.  2.  Primary osteoarthritis of both knees: Gentle range of motion exercises Icing of the knee Tylenol for pain Patient is advised against using ibuprofen because she  is on antiretroviral therapy .   Final Clinical Impressions(s) / UC Diagnoses  Final diagnoses:  Primary osteoarthritis of left knee  Ingrown nail of right ring finger     Discharge Instructions     Use medications as prescribed Avoid keeping your finger in water for prolonged periods of time I will advise that you wear gloves when cooking Return to urgent care if you have any worsening symptoms.   ED Prescriptions    Medication Sig Dispense Auth. Provider   acetaminophen (TYLENOL) 325 MG tablet Take 2 tablets (650 mg total) by mouth every 6 (six) hours as needed.  Chase Picket, MD   diclofenac Sodium (VOLTAREN) 1 % GEL Apply 4 g topically 4 (four) times daily. 150 g Erion Hermans, Myrene Galas, MD   bacitracin ointment Apply 1 application topically 2 (two) times daily. 120 g Hadiya Spoerl, Myrene Galas, MD     PDMP not reviewed this encounter.   Chase Picket, MD 03/02/21 1226    Chase Picket, MD 03/02/21 [phone]

## 2021-03-21 ENCOUNTER — Telehealth: Payer: Self-pay

## 2021-03-21 NOTE — Telephone Encounter (Signed)
Received call from [facility], they have been unable to get in contact with patient regarding delivery of medications. RN attempted to call patient with [facility] interpreter, but [employer] did not have an available [place] agent at this time. [facility] does not have a [place] interpreter. RN confirmed delivery with [facility] to address on file.   Sandie Ano, RN

## 2021-04-05 ENCOUNTER — Ambulatory Visit (HOSPITAL_COMMUNITY)
Admission: EM | Admit: 2021-04-05 | Discharge: 2021-04-05 | Disposition: A | Discharge status: Home / Self Care | Payer: Medicaid Other | Attending: Urgent Care | Admitting: Urgent Care

## 2021-04-05 ENCOUNTER — Encounter (HOSPITAL_COMMUNITY): Payer: Self-pay

## 2021-04-05 DIAGNOSIS — N912 Amenorrhea, unspecified: Secondary | ICD-10-CM | POA: Diagnosis not present

## 2021-04-05 LAB — POC URINE PREG, ED: Preg Test, Ur: NEGATIVE

## 2021-04-05 NOTE — ED Provider Notes (Signed)
[facility]   MRN: [id number] DOB: [birth year]  Subjective:   Marcia Long is a 40 y.o. female presenting for a pregnancy test.  Patient has not had menstrual cycle since April.  She does have a gynecologist and states that she has been seen by them before and has been clear of any problems.  She does have a history of HIV. Denies fever, n/v, abdominal pain, pelvic pain, rashes, dysuria, urinary frequency, hematuria, vaginal discharge.    No current facility-administered medications for this encounter.  Current Outpatient Medications:    acetaminophen (TYLENOL) 325 MG tablet, Take 2 tablets (650 mg total) by mouth every 6 (six) hours as needed., Disp: , Rfl:    bacitracin ointment, Apply 1 application topically 2 (two) times daily., Disp: 120 g, Rfl: 0   diclofenac Sodium (VOLTAREN) 1 % GEL, Apply 4 g topically 4 (four) times daily., Disp: 150 g, Rfl: 0   dolutegravir (TIVICAY) 50 MG tablet, TAKE 1 TABLET(50 MG) BY MOUTH DAILY, Disp: 30 tablet, Rfl: 5   Elastic Bandages & Supports (COMFORT FIT MATERNITY SUPP MED) MISC, Wear daily when ambulating, Disp: 1 each, Rfl: 0   emtricitabine-tenofovir AF (DESCOVY) 200-25 MG tablet, Take 1 tablet by mouth daily., Disp: 30 tablet, Rfl: 5   tiZANidine (ZANAFLEX) 4 MG tablet, Take 1 tablet (4 mg total) by mouth every 8 (eight) hours as needed., Disp: 30 tablet, Rfl: 0   No Known Allergies  Past Medical History:  Diagnosis Date   HIV (human immunodeficiency virus infection) ([place])    HIV (human immunodeficiency virus infection) ([place]) 11/30/2018   Dx 2006 HLA neg Quantiferon neg Hep B sAg neg       Past Surgical History:  Procedure Laterality Date   CESAREAN SECTION N/A 02/24/2019   Procedure: CESAREAN SECTION;  Surgeon: Aletha Halim, MD;  Location: [facility];  Service: Obstetrics;  Laterality: N/A;   NO PAST SURGERIES      History reviewed. No pertinent family history.  Social History   Tobacco Use   Smoking status:  Never   Smokeless tobacco: Never  Vaping Use   Vaping Use: Never used  Substance Use Topics   Alcohol use: Not Currently   Drug use: Not Currently    ROS   Objective:   Vitals: BP 138/70 (BP Location: Left Arm)   Pulse 72   Temp 98.3 F (36.8 C) (Oral)   Resp 17   LMP 02/06/2021 (Approximate)   SpO2 97%   Physical Exam Constitutional:      General: She is not in acute distress.    Appearance: Normal appearance. She is well-developed and normal weight. She is not ill-appearing, toxic-appearing or diaphoretic.  HENT:     Head: Normocephalic and atraumatic.     Right Ear: External ear normal.     Left Ear: External ear normal.     Nose: Nose normal.     Mouth/Throat:     Mouth: Mucous membranes are moist.     Pharynx: Oropharynx is clear.  Eyes:     General: No scleral icterus.    Extraocular Movements: Extraocular movements intact.     Pupils: Pupils are equal, round, and reactive to light.  Cardiovascular:     Rate and Rhythm: Normal rate and regular rhythm.     Heart sounds: Normal heart sounds. No murmur heard.   No friction rub. No gallop.  Pulmonary:     Effort: Pulmonary effort is normal. No respiratory distress.  Breath sounds: Normal breath sounds. No stridor. No wheezing, rhonchi or rales.  Abdominal:     General: Bowel sounds are normal. There is no distension.     Palpations: Abdomen is soft. There is no mass.     Tenderness: There is no abdominal tenderness. There is no right CVA tenderness, left CVA tenderness, guarding or rebound.  Skin:    General: Skin is warm and dry.     Coloration: Skin is not pale.     Findings: No rash.  Neurological:     General: No focal deficit present.     Mental Status: She is alert and oriented to person, place, and time.  Psychiatric:        Mood and Affect: Mood normal.        Behavior: Behavior normal.        Thought Content: Thought content normal.        Judgment: Judgment normal.      Assessment and  Plan :   PDMP not reviewed this encounter.  1. Amenorrhea     Patient is hemodynamically stable and asymptomatic.  Recommended follow-up with gynecologist.  Patient declined STI testing.  Given lack of urinary symptoms, did not perform a urinalysis.    Jaynee Eagles, PA-C 04/05/21 1227

## 2021-04-05 NOTE — ED Triage Notes (Signed)
Pt in requesting pregnancy test  Denies any N/V, breast tenderness  States LMP was in April

## 2021-04-07 ENCOUNTER — Other Ambulatory Visit: Payer: Self-pay | Admitting: Infectious Diseases

## 2021-04-07 DIAGNOSIS — Z21 Asymptomatic human immunodeficiency virus [HIV] infection status: Secondary | ICD-10-CM

## 2021-04-14 ENCOUNTER — Ambulatory Visit (HOSPITAL_COMMUNITY)
Admission: EM | Admit: 2021-04-14 | Discharge: 2021-04-14 | Disposition: A | Discharge status: Home / Self Care | Payer: Medicaid Other | Attending: Family Medicine | Admitting: Family Medicine

## 2021-04-14 ENCOUNTER — Other Ambulatory Visit: Payer: Self-pay

## 2021-04-14 ENCOUNTER — Encounter (HOSPITAL_COMMUNITY): Payer: Self-pay | Admitting: Emergency Medicine

## 2021-04-14 DIAGNOSIS — R110 Nausea: Secondary | ICD-10-CM | POA: Diagnosis not present

## 2021-04-14 DIAGNOSIS — B349 Viral infection, unspecified: Secondary | ICD-10-CM | POA: Diagnosis not present

## 2021-04-14 DIAGNOSIS — Z20822 Contact with and (suspected) exposure to covid-19: Secondary | ICD-10-CM | POA: Diagnosis not present

## 2021-04-14 DIAGNOSIS — R6883 Chills (without fever): Secondary | ICD-10-CM | POA: Diagnosis not present

## 2021-04-14 DIAGNOSIS — Z21 Asymptomatic human immunodeficiency virus [HIV] infection status: Secondary | ICD-10-CM | POA: Diagnosis not present

## 2021-04-14 LAB — POC INFLUENZA A AND B ANTIGEN (URGENT CARE ONLY)
INFLUENZA A ANTIGEN, POC: NEGATIVE
INFLUENZA B ANTIGEN, POC: NEGATIVE

## 2021-04-14 LAB — SARS CORONAVIRUS 2 (TAT 6-24 HRS): SARS Coronavirus 2: NEGATIVE

## 2021-04-14 NOTE — ED Provider Notes (Signed)
[facility]   [id number] 04/14/21 Arrival Time: 0900   CC: COVID symptoms  SUBJECTIVE: History from: family.  Marcia Long is a 40 y.o. female who presents with chills, sweats, fatigue, nausea for the last 4 days. Has hx HIV and has been taking her suppressive therapy as prescribed. Denies sick exposure to COVID, flu or strep. Denies recent travel. Has negative history of Covid. Has completed Covid vaccines. Has not taken OTC medications for this. There are no aggravating or alleviating factors. Denies previous symptoms in the past. Denies fever, sinus pain, rhinorrhea, sore throat, SOB, wheezing, chest pain, nausea, changes in bowel or bladder habits.    ROS: As per HPI.  All other pertinent ROS negative.     Past Medical History:  Diagnosis Date   HIV (human immunodeficiency virus infection) ([place])    HIV (human immunodeficiency virus infection) ([place]) 11/30/2018   Dx 2006 HLA neg Quantiferon neg Hep B sAg neg     Past Surgical History:  Procedure Laterality Date   CESAREAN SECTION N/A 02/24/2019   Procedure: CESAREAN SECTION;  Surgeon: Aletha Halim, MD;  Location: [facility];  Service: Obstetrics;  Laterality: N/A;   NO PAST SURGERIES     No Known Allergies No current facility-administered medications on file prior to encounter.   Current Outpatient Medications on File Prior to Encounter  Medication Sig Dispense Refill   acetaminophen (TYLENOL) 325 MG tablet Take 2 tablets (650 mg total) by mouth every 6 (six) hours as needed.     bacitracin ointment Apply 1 application topically 2 (two) times daily. (Patient not taking: Reported on 04/14/2021) 120 g 0   diclofenac Sodium (VOLTAREN) 1 % GEL Apply 4 g topically 4 (four) times daily. (Patient not taking: Reported on 04/14/2021) 150 g 0   dolutegravir (TIVICAY) 50 MG tablet TAKE 1 TABLET(50 MG) BY MOUTH DAILY 30 tablet 5   Elastic Bandages & Supports (COMFORT FIT MATERNITY SUPP MED) MISC Wear daily when ambulating (Patient  not taking: Reported on 04/14/2021) 1 each 0   emtricitabine-tenofovir AF (DESCOVY) 200-25 MG tablet Take 1 tablet by mouth daily. 30 tablet 5   tiZANidine (ZANAFLEX) 4 MG tablet Take 1 tablet (4 mg total) by mouth every 8 (eight) hours as needed. (Patient not taking: Reported on 04/14/2021) 30 tablet 0   Social History   Socioeconomic History   Marital status: Married    Spouse name: Not on file   Number of children: Not on file   Years of education: Not on file   Highest education level: Never attended school  Occupational History   Not on file  Tobacco Use   Smoking status: Never   Smokeless tobacco: Never  Vaping Use   Vaping Use: Never used  Substance and Sexual Activity   Alcohol use: Not Currently   Drug use: Not Currently   Sexual activity: Yes    Birth control/protection: None  Other Topics Concern   Not on file  Social History Narrative   Not on file   Social Determinants of Health   Financial Resource Strain: Not on file  Food Insecurity: Not on file  Transportation Needs: Not on file  Physical Activity: Not on file  Stress: Not on file  Social Connections: Not on file  Intimate Partner Violence: Not on file   History reviewed. No pertinent family history.  OBJECTIVE:  Vitals:   04/14/21 1005  BP: 127/71  Pulse: (!) 57  Resp: 20  Temp: 98.3 F (36.8 C)  TempSrc: Oral  SpO2: 100%     General appearance: alert; appears fatigued, but nontoxic; speaking in full sentences and tolerating own secretions HEENT: NCAT; Ears: EACs clear, TMs pearly gray; Eyes: PERRL.  EOM grossly intact. Sinuses: nontender; Nose: nares patent with clear rhinorrhea, Throat: oropharynx erythematous, cobblestoning present, tonsils non erythematous or enlarged, uvula midline  Neck: supple without LAD Lungs: unlabored respirations, symmetrical air entry; cough: absent; no respiratory distress; CTAB Heart: regular rate and rhythm.  Radial pulses 2+ symmetrical bilaterally Skin: warm  and dry Psychological: alert and cooperative; normal mood and affect  LABS:  Results for orders placed or performed during the hospital encounter of 04/14/21 (from the past 24 hour(s))  POC Influenza A & B Ag (Urgent Care)     Status: None   Collection Time: 04/14/21 11:09 AM  Result Value Ref Range   INFLUENZA A ANTIGEN, POC NEGATIVE NEGATIVE   INFLUENZA B ANTIGEN, POC NEGATIVE NEGATIVE     ASSESSMENT & PLAN:  1. Viral illness   2. Nausea   3. Chills     Rapid flu negative Continue supportive care at home COVID testing ordered.  It will take between 2-3 days for test results. Someone will contact you regarding abnormal results.   Work note provided Patient should remain in quarantine until they have received Covid results.  If negative you may resume normal activities (go back to work/school) while practicing hand hygiene, social distance, and mask wearing.  If positive, patient should remain in quarantine for at least 5 days from symptom onset AND greater than 72 hours after symptoms resolution (absence of fever without the use of fever-reducing medication and improvement in respiratory symptoms), whichever is longer Get plenty of rest and push fluids Use OTC zyrtec for nasal congestion, runny nose, and/or sore throat Use OTC flonase for nasal congestion and runny nose Use medications daily for symptom relief Use OTC medications like ibuprofen or tylenol as needed fever or pain Call or go to the ED if you have any new or worsening symptoms such as fever, worsening cough, shortness of breath, chest tightness, chest pain, turning blue, changes in mental status.  Reviewed expectations re: course of current medical issues. Questions answered. Outlined signs and symptoms indicating need for more acute intervention. Patient verbalized understanding. After Visit Summary given.          Faustino Congress, NP 04/14/21 1130

## 2021-04-14 NOTE — ED Triage Notes (Signed)
Patient seen 04/05/2021  Patient complains of hot and cold episodes, nausea, no vomiting.  Symptoms for 4 days  Patient speaks sango.  No one available through translator services.  Patient has 40 y.o daughter with her that speaks [place]

## 2021-04-14 NOTE — Discharge Instructions (Addendum)
I have sent in Zofran for you to take one tablet every 8 hours as needed for nausea.  May take tylenol or ibuprofen  Drink plenty of fluids  Follow up with this office or with primary care if symptoms are persisting.  Follow up in the ER for high fever, trouble swallowing, trouble breathing, other concerning symptoms.

## 2021-04-15 ENCOUNTER — Telehealth (HOSPITAL_COMMUNITY): Payer: Self-pay | Admitting: Emergency Medicine

## 2021-04-15 MED ORDER — ONDANSETRON HCL 4 MG PO TABS: 4.0000 mg | ORAL_TABLET | Freq: Three times a day (TID) | ORAL | 0 refills | Status: DC | PRN

## 2021-04-15 NOTE — Telephone Encounter (Signed)
Zofran prescription was not received by the pharmacy.  Prescription sent.

## 2021-04-29 ENCOUNTER — Telehealth: Payer: Self-pay | Admitting: *Deleted

## 2021-04-29 NOTE — Telephone Encounter (Signed)
[facility] pharmacy called to confirm patient's address for monthly delivery of mediation. Per their records, Marcia Long gave permission for [facility] to call [facility] triage monthly to confirm her address for medication delivery. RN confirmed address, [facility] will send refills. Andree Coss, RN

## 2021-05-08 ENCOUNTER — Other Ambulatory Visit: Payer: Medicaid Other

## 2021-05-08 ENCOUNTER — Other Ambulatory Visit: Payer: Self-pay

## 2021-05-08 DIAGNOSIS — Z21 Asymptomatic human immunodeficiency virus [HIV] infection status: Secondary | ICD-10-CM | POA: Diagnosis not present

## 2021-05-08 DIAGNOSIS — R7309 Other abnormal glucose: Secondary | ICD-10-CM

## 2021-05-12 LAB — HIV-1 RNA QUANT-NO REFLEX-BLD
HIV 1 RNA Quant: NOT DETECTED Copies/mL
HIV-1 RNA Quant, Log: NOT DETECTED Log cps/mL

## 2021-05-12 LAB — HEMOGLOBIN A1C
Hgb A1c MFr Bld: 5.9 % of total Hgb — ABNORMAL HIGH (ref <5.7)
Mean Plasma Glucose: 123 mg/dL
eAG (mmol/L): 6.8 mmol/L

## 2021-05-22 ENCOUNTER — Ambulatory Visit (INDEPENDENT_AMBULATORY_CARE_PROVIDER_SITE_OTHER): Payer: Medicaid Other | Admitting: Infectious Diseases

## 2021-05-22 ENCOUNTER — Encounter: Payer: Self-pay | Admitting: Infectious Diseases

## 2021-05-22 ENCOUNTER — Other Ambulatory Visit: Payer: Self-pay

## 2021-05-22 VITALS — BP 125/82 | HR 52 | Temp 97.8°F | Wt 188.0 lb

## 2021-05-22 DIAGNOSIS — B351 Tinea unguium: Secondary | ICD-10-CM | POA: Diagnosis not present

## 2021-05-22 DIAGNOSIS — Z21 Asymptomatic human immunodeficiency virus [HIV] infection status: Secondary | ICD-10-CM

## 2021-05-22 DIAGNOSIS — B20 Human immunodeficiency virus [HIV] disease: Secondary | ICD-10-CM | POA: Diagnosis not present

## 2021-05-22 MED ORDER — DESCOVY 200-25 MG PO TABS: 1.0000 | ORAL_TABLET | Freq: Every day | ORAL | 11 refills | Status: DC

## 2021-05-22 MED ORDER — TIVICAY 50 MG PO TABS: ORAL_TABLET | ORAL | 11 refills | Status: DC

## 2021-05-22 NOTE — Assessment & Plan Note (Signed)
HIV 1 RNA Quant (Copies/mL)  Date Value  05/08/2021 Not Detected  11/19/2020 <20 (H)  07/03/2020 <20 (H)   CD4 T Cell Abs (/uL)  Date Value  11/19/2020 815  07/03/2020 1,099  12/08/2019 840   Gaudet has been doing well on her medication with viral loads undetectable and immune system in the normal range over 800 cells.  She is up-to-date on vaccines today including COVID booster per current recommendations.  She has been able to get her medications from the pharmacy through mail order.

## 2021-05-22 NOTE — Addendum Note (Signed)
Addended by: Blanchard Kelch on: 05/22/2021 12:39 PM   Modules accepted: Orders

## 2021-05-22 NOTE — Progress Notes (Signed)
Name: Marcia Long  DOB: [birth year] MRN: [id number] PCP: Patient, No Pcp Per (Inactive)      Brief Narrative:  Marcia Long is a 40 y.o. female with HIV disease, dx 2006. Immigrated from [place] [id number] as refugee. Native language [place]. 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 Married, husband HIV+.  History of OIs: unknown.  Previous Regimens: Odefsey 2017 >> undetectable Tivicay + Truvada during pregnancy 2019/2020 Tivicay + Descovy 03-2019  Genotypes: 2017 chart records indicate no major RT, PI, INSTI mutations   Subjective:   CC: HIV follow-up care Nail pain and disfiguration ongoing.   HPI:  Unfortunately there was no interpreter available for [place] despite efforts to arrange this heavy time.  She is able to say she is doing well overall. She is getting her medications in the mail  and has been taking it.  She takes her Tivicay and Descovy every day without any missing of doses.  She had her Moderna booster and is up-to-date for current recommendations for COVID protection.  Finger pain on the right ring finger - discolored and misshaped now with more pain in the pad of the finger. Would like me to take her nail off.  She says she tried the antifungal medication that I gave her but it did not help. She has pain in the pad of this finger.     Review of Systems  Constitutional:  Negative for chills, fever, malaise/fatigue and weight loss.  Respiratory:  Negative for cough, sputum production and shortness of breath.   Cardiovascular: Negative.   Gastrointestinal:  Negative for abdominal pain, diarrhea and vomiting.  Musculoskeletal:  Negative for joint pain, myalgias and neck pain.  Skin:  Positive for rash (finger and nail pain as described above).  Neurological:  Negative for headaches.  Psychiatric/Behavioral:  Negative for depression and substance abuse. The patient is not nervous/anxious.    Past Medical History:  Diagnosis  Date   HIV (human immunodeficiency virus infection) ([place])    HIV (human immunodeficiency virus infection) ([place]) 11/30/2018   Dx 2006 HLA neg Quantiferon neg Hep B sAg neg      Outpatient Medications Prior to Visit  Medication Sig Dispense Refill   acetaminophen (TYLENOL) 325 MG tablet Take 2 tablets (650 mg total) by mouth every 6 (six) hours as needed.     bacitracin ointment Apply 1 application topically 2 (two) times daily. (Patient not taking: Reported on 04/14/2021) 120 g 0   diclofenac Sodium (VOLTAREN) 1 % GEL Apply 4 g topically 4 (four) times daily. (Patient not taking: Reported on 04/14/2021) 150 g 0   Elastic Bandages & Supports (COMFORT FIT MATERNITY SUPP MED) MISC Wear daily when ambulating (Patient not taking: Reported on 04/14/2021) 1 each 0   ondansetron (ZOFRAN) 4 MG tablet Take 1 tablet (4 mg total) by mouth every 8 (eight) hours as needed for nausea or vomiting. 12 tablet 0   tiZANidine (ZANAFLEX) 4 MG tablet Take 1 tablet (4 mg total) by mouth every 8 (eight) hours as needed. (Patient not taking: Reported on 04/14/2021) 30 tablet 0   dolutegravir (TIVICAY) 50 MG tablet TAKE 1 TABLET(50 MG) BY MOUTH DAILY 30 tablet 5   emtricitabine-tenofovir AF (DESCOVY) 200-25 MG tablet Take 1 tablet by mouth daily. 30 tablet 5   No facility-administered medications prior to visit.     No Known Allergies  Social History   Tobacco Use   Smoking status: Never   Smokeless tobacco: Never  Vaping  Use   Vaping Use: Never used  Substance Use Topics   Alcohol use: Not Currently   Drug use: Not Currently    Social History   Substance and Sexual Activity  Sexual Activity Yes   Birth control/protection: None     Objective:   Vitals:   05/22/21 0940  BP: 125/82  Pulse: (!) 52  Temp: 97.8 F (36.6 C)  TempSrc: Oral  SpO2: 100%  Weight: 188 lb (85.3 kg)   Body mass index is 30.81 kg/m.    Physical Exam HENT:     Mouth/Throat:     Mouth: No oral lesions.     Dentition:  No dental abscesses.  Cardiovascular:     Rate and Rhythm: Normal rate and regular rhythm.     Heart sounds: Normal heart sounds.  Pulmonary:     Effort: Pulmonary effort is normal.     Breath sounds: Normal breath sounds.  Abdominal:     General: There is no distension.     Palpations: Abdomen is soft.     Tenderness: There is no abdominal tenderness.  Musculoskeletal:        General: No tenderness. Normal range of motion.  Lymphadenopathy:     Cervical: No cervical adenopathy.  Skin:    General: Skin is warm and dry.     Findings: No rash.  Neurological:     Mental Status: She is alert and oriented to person, place, and time.  Psychiatric:        Judgment: Judgment normal.      Lab Results Lab Results  Component Value Date   WBC 3.6 (L) 11/19/2020   HGB 12.2 11/19/2020   HCT 36.2 11/19/2020   MCV 87.2 11/19/2020   PLT 255 11/19/2020    Lab Results  Component Value Date   CREATININE 0.70 11/19/2020   BUN 10 11/19/2020   NA 138 11/19/2020   K 4.2 11/19/2020   CL 106 11/19/2020   CO2 25 11/19/2020    Lab Results  Component Value Date   ALT 27 11/19/2020   AST 20 11/19/2020   BILITOT 0.3 11/19/2020    Lab Results  Component Value Date   CHOL 145 11/19/2020   HDL 57 11/19/2020   LDLCALC 73 11/19/2020   TRIG 69 11/19/2020   CHOLHDL 2.5 11/19/2020   HIV 1 RNA Quant (Copies/mL)  Date Value  05/08/2021 Not Detected  11/19/2020 <20 (H)  07/03/2020 <20 (H)   CD4 T Cell Abs (/uL)  Date Value  11/19/2020 815  07/03/2020 1,099  12/08/2019 840     Assessment & Plan:   Problem List Items Addressed This Visit       High   Human immunodeficiency virus (HIV) disease ([place]) (Chronic)    HIV 1 RNA Quant (Copies/mL)  Date Value  05/08/2021 Not Detected  11/19/2020 <20 (H)  07/03/2020 <20 (H)   CD4 T Cell Abs (/uL)  Date Value  11/19/2020 815  07/03/2020 1,099  12/08/2019 840  Gaudet has been doing well on her medication with viral loads  undetectable and immune system in the normal range over 800 cells.  She is up-to-date on vaccines today including COVID booster per current recommendations.  She has been able to get her medications from the pharmacy through mail order.       Relevant Medications   dolutegravir (TIVICAY) 50 MG tablet   emtricitabine-tenofovir AF (DESCOVY) 200-25 MG tablet     Unprioritized   Fungal infection of nail  Failed oral treatment with antifungal. I believe the nail probably needs to come off to be properly treated - will refer to Dermatology.        Relevant Medications   dolutegravir (TIVICAY) 50 MG tablet   emtricitabine-tenofovir AF (DESCOVY) 200-25 MG tablet   Other Relevant Orders   Ambulatory referral to Dermatology   Asymptomatic HIV infection ([place]) - Primary   Relevant Medications   dolutegravir (TIVICAY) 50 MG tablet   emtricitabine-tenofovir AF (DESCOVY) 200-25 MG tablet   Janene Madeira, MSN, NP-C [facility] [place] Pager: [phone] Office: [phone]  05/22/21  10:16 AM

## 2021-05-22 NOTE — Assessment & Plan Note (Signed)
Failed oral treatment with antifungal. I believe the nail probably needs to come off to be properly treated - will refer to Dermatology.

## 2021-05-29 ENCOUNTER — Telehealth: Payer: Self-pay

## 2021-05-29 NOTE — Telephone Encounter (Signed)
[facility] pharmacy called to set up home delivery. Advised pharmacy to contact patient's daughter who can help with interpretation.   Sandie Ano, RN

## 2021-08-15 ENCOUNTER — Telehealth: Payer: Self-pay | Admitting: *Deleted

## 2021-08-15 NOTE — Telephone Encounter (Signed)
[employer] does have a Sales executive available, but only by appointment. To schedule this, please contact [employer] at [phone] at least 1 week in advance. The interpreter will be on standby 15 minutes before and after the scheduled appointment time. This can be for phone or in-person appointments. They can also be reached at [email or web address] or the main phone number of [phone]. Andree Coss, RN

## 2021-08-18 ENCOUNTER — Ambulatory Visit: Payer: Medicaid Other | Admitting: Pharmacist

## 2021-08-18 ENCOUNTER — Other Ambulatory Visit (HOSPITAL_COMMUNITY)
Admission: RE | Admit: 2021-08-18 | Discharge: 2021-08-18 | Disposition: A | Discharge status: Home / Self Care | Payer: Medicaid Other | Source: Ambulatory Visit | Attending: Infectious Diseases | Admitting: Infectious Diseases

## 2021-08-18 ENCOUNTER — Other Ambulatory Visit: Payer: Self-pay

## 2021-08-18 ENCOUNTER — Other Ambulatory Visit: Payer: Medicaid Other

## 2021-08-18 DIAGNOSIS — Z0000 Encounter for general adult medical examination without abnormal findings: Secondary | ICD-10-CM

## 2021-08-18 DIAGNOSIS — Z21 Asymptomatic human immunodeficiency virus [HIV] infection status: Secondary | ICD-10-CM | POA: Diagnosis not present

## 2021-08-18 DIAGNOSIS — Z79899 Other long term (current) drug therapy: Secondary | ICD-10-CM | POA: Diagnosis not present

## 2021-08-18 DIAGNOSIS — Z113 Encounter for screening for infections with a predominantly sexual mode of transmission: Secondary | ICD-10-CM | POA: Diagnosis not present

## 2021-08-18 DIAGNOSIS — B20 Human immunodeficiency virus [HIV] disease: Secondary | ICD-10-CM | POA: Diagnosis not present

## 2021-08-19 LAB — URINE CYTOLOGY ANCILLARY ONLY
Chlamydia: NEGATIVE
Comment: NEGATIVE
Comment: NORMAL
Neisseria Gonorrhea: NEGATIVE

## 2021-08-19 LAB — T-HELPER CELL (CD4) - (RCID CLINIC ONLY)
CD4 % Helper T Cell: 45 % (ref 33–65)
CD4 T Cell Abs: 830 /uL (ref 400–1790)

## 2021-08-20 LAB — RPR: RPR Ser Ql: NONREACTIVE

## 2021-08-20 LAB — LIPID PANEL
Cholesterol: 156 mg/dL (ref <200)
HDL: 57 mg/dL (ref >=50)
LDL Cholesterol (Calc): 89 mg/dL (calc)
Non-HDL Cholesterol (Calc): 99 mg/dL (calc) (ref <130)
Total CHOL/HDL Ratio: 2.7 (calc) (ref <5.0)
Triglycerides: 35 mg/dL (ref <150)

## 2021-08-20 LAB — COMPREHENSIVE METABOLIC PANEL
AG Ratio: 1.6 (calc) (ref 1.0–2.5)
ALT: 17 U/L (ref 6–29)
AST: 13 U/L (ref 10–30)
Albumin: 3.9 g/dL (ref 3.6–5.1)
Alkaline phosphatase (APISO): 45 U/L (ref 31–125)
BUN: 14 mg/dL (ref 7–25)
CO2: 26 mmol/L (ref 20–32)
Calcium: 8.6 mg/dL (ref 8.6–10.2)
Chloride: 107 mmol/L (ref 98–110)
Creat: 0.83 mg/dL (ref 0.50–0.99)
Globulin: 2.5 g/dL (calc) (ref 1.9–3.7)
Glucose, Bld: 108 mg/dL — ABNORMAL HIGH (ref 65–99)
Potassium: 4.3 mmol/L (ref 3.5–5.3)
Sodium: 141 mmol/L (ref 135–146)
Total Bilirubin: 0.4 mg/dL (ref 0.2–1.2)
Total Protein: 6.4 g/dL (ref 6.1–8.1)

## 2021-08-20 LAB — HIV-1 RNA QUANT-NO REFLEX-BLD
HIV 1 RNA Quant: 28 Copies/mL — ABNORMAL HIGH
HIV-1 RNA Quant, Log: 1.45 Log cps/mL — ABNORMAL HIGH

## 2021-08-20 LAB — CBC WITH DIFFERENTIAL/PLATELET
Absolute Monocytes: 360 cells/uL (ref 200–950)
Basophils Absolute: 41 cells/uL (ref 0–200)
Basophils Relative: 1.2 %
Eosinophils Absolute: 292 cells/uL (ref 15–500)
Eosinophils Relative: 8.6 %
HCT: 37.5 % (ref 35.0–45.0)
Hemoglobin: 12.5 g/dL (ref 11.7–15.5)
Lymphs Abs: 1846 cells/uL (ref 850–3900)
MCH: 29.6 pg (ref 27.0–33.0)
MCHC: 33.3 g/dL (ref 32.0–36.0)
MCV: 88.7 fL (ref 80.0–100.0)
MPV: 10.7 fL (ref 7.5–12.5)
Monocytes Relative: 10.6 %
Neutro Abs: 860 cells/uL — ABNORMAL LOW (ref 1500–7800)
Neutrophils Relative %: 25.3 %
Platelets: 234 10*3/uL (ref 140–400)
RBC: 4.23 10*6/uL (ref 3.80–5.10)
RDW: 13.2 % (ref 11.0–15.0)
Total Lymphocyte: 54.3 %
WBC: 3.4 10*3/uL — ABNORMAL LOW (ref 3.8–10.8)

## 2021-09-02 ENCOUNTER — Other Ambulatory Visit: Payer: Self-pay

## 2021-09-02 ENCOUNTER — Other Ambulatory Visit (HOSPITAL_COMMUNITY): Payer: Self-pay

## 2021-09-02 ENCOUNTER — Ambulatory Visit (INDEPENDENT_AMBULATORY_CARE_PROVIDER_SITE_OTHER): Payer: Medicaid Other | Admitting: Pharmacist

## 2021-09-02 ENCOUNTER — Telehealth: Payer: Self-pay | Admitting: Pharmacist

## 2021-09-02 DIAGNOSIS — B20 Human immunodeficiency virus [HIV] disease: Secondary | ICD-10-CM | POA: Diagnosis not present

## 2021-09-02 MED ORDER — BIKTARVY 50-200-25 MG PO TABS: 1.0000 | ORAL_TABLET | Freq: Every day | ORAL | 2 refills | Status: DC

## 2021-09-02 NOTE — Progress Notes (Signed)
09/02/2021  HPI: Marcia Long is a 40 y.o. female who presents to the [place] clinic for HIV follow-up.  Patient Active Problem List   Diagnosis Date Noted   Fungal infection of nail 12/12/2020   Paronychia of right ring finger 12/12/2020   Screening for cervical cancer 12/12/2020   Asymptomatic HIV infection ([place]) 12/12/2020   Elevated glucose level 08/14/2019   Healthcare maintenance 04/23/2019   Human immunodeficiency virus (HIV) disease ([place]) 11/30/2018   Chronic dental pain 11/30/2018   Language barrier 11/03/2018    Patient's Medications  New Prescriptions   No medications on file  Previous Medications   ACETAMINOPHEN (TYLENOL) 325 MG TABLET    Take 2 tablets (650 mg total) by mouth every 6 (six) hours as needed.   BACITRACIN OINTMENT    Apply 1 application topically 2 (two) times daily.   DICLOFENAC SODIUM (VOLTAREN) 1 % GEL    Apply 4 g topically 4 (four) times daily.   DOLUTEGRAVIR (TIVICAY) 50 MG TABLET    TAKE 1 TABLET(50 MG) BY MOUTH DAILY   ELASTIC BANDAGES & SUPPORTS (COMFORT FIT MATERNITY SUPP MED) MISC    Wear daily when ambulating   EMTRICITABINE-TENOFOVIR AF (DESCOVY) 200-25 MG TABLET    Take 1 tablet by mouth daily.   ONDANSETRON (ZOFRAN) 4 MG TABLET    Take 1 tablet (4 mg total) by mouth every 8 (eight) hours as needed for nausea or vomiting.   TIZANIDINE (ZANAFLEX) 4 MG TABLET    Take 1 tablet (4 mg total) by mouth every 8 (eight) hours as needed.  Modified Medications   No medications on file  Discontinued Medications   No medications on file    Allergies: No Known Allergies  Past Medical History: Past Medical History:  Diagnosis Date   HIV (human immunodeficiency virus infection) ([place])    HIV (human immunodeficiency virus infection) ([place]) 11/30/2018   Dx 2006 HLA neg Quantiferon neg Hep B sAg neg      Social History: Social History   Socioeconomic History   Marital status: Married    Spouse name: Not on file   Number of children: Not  on file   Years of education: Not on file   Highest education level: Never attended school  Occupational History   Not on file  Tobacco Use   Smoking status: Never   Smokeless tobacco: Never  Vaping Use   Vaping Use: Never used  Substance and Sexual Activity   Alcohol use: Not Currently   Drug use: Not Currently   Sexual activity: Yes    Birth control/protection: None  Other Topics Concern   Not on file  Social History Narrative   Not on file   Social Determinants of Health   Financial Resource Strain: Not on file  Food Insecurity: Not on file  Transportation Needs: Not on file  Physical Activity: Not on file  Stress: Not on file  Social Connections: Not on file    Labs: Lab Results  Component Value Date   HIV1RNAQUANT 28 (H) 08/18/2021   HIV1RNAQUANT Not Detected 05/08/2021   HIV1RNAQUANT <20 (H) 11/19/2020   CD4TABS 830 08/18/2021   CD4TABS 815 11/19/2020   CD4TABS 1,099 07/03/2020    RPR and STI Lab Results  Component Value Date   LABRPR NON-REACTIVE 08/18/2021   LABRPR NON-REACTIVE 11/19/2020   LABRPR NON-REACTIVE 07/03/2020   LABRPR NON-REACTIVE 12/08/2019   LABRPR Reactive (A) 02/23/2019    STI Results GC CT  08/18/2021 Negative Negative  12/12/2020  Negative Negative  12/08/2019 Negative Negative  11/15/2018 Negative Negative  10/17/2018 - Negative    Hepatitis B Lab Results  Component Value Date   HEPBSAB REACTIVE (A) 11/15/2018   HEPBSAG NON-REACTIVE 11/15/2018   HEPBCAB REACTIVE (A) 11/15/2018   Hepatitis C Lab Results  Component Value Date   HEPCAB NON-REACTIVE 11/15/2018   Hepatitis A Lab Results  Component Value Date   HAV REACTIVE (A) 11/15/2018   Lipids: Lab Results  Component Value Date   CHOL 156 08/18/2021   TRIG 35 08/18/2021   HDL 57 08/18/2021   CHOLHDL 2.7 08/18/2021   LDLCALC 89 08/18/2021    Current HIV Regimen: Tivicay and Descovy  Assessment: Unfortunately there was no interpreter available for [place]  despite efforts to arrange this several times.  She is able to speak limited [place]. She is receiving medications via mail without any issues. We discussed the possibility of switching to [facility] for ease of administration with a once daily pill. She expressed interest and requested to switch to [employer]. We provided her with 2 weeks of Biktarvy samples and will send in a new prescription to [facility]. I will follow-up in 2 weeks to ensure medication have been received. Patient expressed frustration over another appointment without an interpreter. I assured her that we have worked hard to have one at her appointments, but there is not one available today. We will work on this further for her future appointments to provide the best care and communication with the patient.   Plan: Stop Tivicay and Descovy Start [place]- sent prescription to [facility] in [place], [place] Follow-up with Colletta [place] on 12/16 @ 10:00 and set up interpreter for [place] language   Lestine Box, PharmD PGY2 Infectious Diseases Pharmacy Resident

## 2021-09-02 NOTE — Telephone Encounter (Signed)
Trinna Post, PGY2 ID pharmacy resident, saw patient and patient's partner today in clinic for follow up. We were, again, unable to schedule a [place] interpreter for their visit, which they were upset about. Marcia Long speaks a little bit of English, so Trinna Post was able to discuss limited information with her. Refer to her note from today for full visit details. We switched her to [facility] for simplification. She has no issues getting it from [facility], but her partner (who does not speak [place]) does have issues. He ran out on Friday and is unable to speak to them as they do not have an interpreter service for [facility] either. We gave both of them Biktarvy samples.   We have been unable to establish an interpreter for their visit several times now, which to me, is quite inexcusable. I emailed Catering manager of Inclusion and Diversity at [employer] today to see if she can help [place] identify a solution.  They both have a follow up scheduled with Judeth Cornfield on 12/16. An interpreter will need to be set up for this appointment (or try to be). [name] was upset but not angry and pleaded for [place] to have someone at next visit or their visit is somewhat pointless. Their daughter helps them and apparently speaks english well.. so maybe we should try and get her to accompany them to their visit if we cannot get an interpreter set up? I know she is very young. Just a thought. Will route to nurse triage team and [facility] for advice/follow up.  Patient also wanted Judeth Cornfield to help with citizenship, which I am not sure what that entails. I asked triage and they were not sure either. Sherlynn Carbon.

## 2021-09-02 NOTE — Telephone Encounter (Signed)
Yes, I understand that isn't fair to her, but I also feel like we don't have many other options. I was just pulling at straws here.

## 2021-09-02 NOTE — Telephone Encounter (Signed)
Thanks Aundra Millet! The last 2 times we have set that up.. they email saying that one isn't actually available, so hopefully it actually works this time!

## 2021-09-03 ENCOUNTER — Other Ambulatory Visit: Payer: Self-pay | Admitting: Pharmacist

## 2021-09-03 DIAGNOSIS — B20 Human immunodeficiency virus [HIV] disease: Secondary | ICD-10-CM

## 2021-09-03 MED ORDER — BICTEGRAVIR-EMTRICITAB-TENOFOV 50-200-25 MG PO TABS: 1.0000 | ORAL_TABLET | Freq: Every day | ORAL | 0 refills | Status: AC

## 2021-09-03 NOTE — Progress Notes (Signed)
Medication Samples have been provided to the patient.  Drug name: Biktarvy        Strength: 50/200/25 mg       Qty: 14 tablets (2 bottles) LOT: [id number]   Exp.Date: 10/24  Dosing instructions: Take one tablet by mouth once daily  The patient has been instructed regarding the correct time, dose, and frequency of taking this medication, including desired effects and most common side effects.   Alivya Wegman, PharmD, CPP Clinical Pharmacist Practitioner Infectious Diseases Clinical Pharmacist [facility]  

## 2021-10-10 ENCOUNTER — Other Ambulatory Visit: Payer: Self-pay

## 2021-10-10 ENCOUNTER — Encounter: Payer: Self-pay | Admitting: Infectious Diseases

## 2021-10-10 ENCOUNTER — Ambulatory Visit
Admission: RE | Admit: 2021-10-10 | Discharge: 2021-10-10 | Disposition: A | Discharge status: Home / Self Care | Payer: Medicaid Other | Source: Ambulatory Visit | Attending: Infectious Diseases | Admitting: Infectious Diseases

## 2021-10-10 ENCOUNTER — Ambulatory Visit (INDEPENDENT_AMBULATORY_CARE_PROVIDER_SITE_OTHER): Payer: Medicaid Other | Admitting: Infectious Diseases

## 2021-10-10 VITALS — BP 129/81 | HR 43 | Temp 97.8°F | Wt 193.6 lb

## 2021-10-10 DIAGNOSIS — R0602 Shortness of breath: Secondary | ICD-10-CM | POA: Diagnosis not present

## 2021-10-10 DIAGNOSIS — B20 Human immunodeficiency virus [HIV] disease: Secondary | ICD-10-CM

## 2021-10-10 MED ORDER — EMTRICITAB-RILPIVIR-TENOFOV AF 200-25-25 MG PO TABS: 1.0000 | ORAL_TABLET | Freq: Every day | ORAL | 5 refills | Status: DC

## 2021-10-10 MED ORDER — ALBUTEROL SULFATE HFA 108 (90 BASE) MCG/ACT IN AERS: 1.0000 | INHALATION_SPRAY | Freq: Four times a day (QID) | RESPIRATORY_TRACT | 5 refills | Status: DC | PRN

## 2021-10-10 NOTE — Progress Notes (Unsigned)
Name: Marcia Long  DOB: [birth year] MRN: [id number] PCP: Patient, No Pcp Per (Inactive)     Brief Narrative:  Marcia Long is a 40 y.o. female with HIV disease, dx 2006. Immigrated from [phone] as refugee. Native language [place]. In care at [facility] previously. Married, husband HIV+.  History of OIs: unknown.  Previous Regimens: Odefsey 2017 >> undetectable Tivicay + Truvada during pregnancy 2019/2020 Tivicay + Descovy 03-2019 Biktarvy 2022 >> rash Odefsey 2022  Genotypes: 2017 chart records indicate no major RT, PI, INSTI mutations   Subjective:   CC: HIV follow-up care New shortness of breath    HPI:  Telephone interpretor available today for interpretation.   She started on new med (biktarvy) has has noticed some itchy rashes on her neck and legs. This is raised and bumpy. No new clothing or jewelry.  Wondering if this is the medication. Would prefer single tablet if we can.   Has had a hard time with shortness of breath lately.  She sometimes feels like her breath is going to stop at times. This has been three months ongoing now. When it starts it is a hard time breathing where she has to rest and sit down. Usually happens at work when she is working hard or when she is at home playing with the kids. No sweating, chest pains or pressure. It always resolves with rest. She is sleeping OK flat. During these episodes she does have coughing during these episodes. Never had this before as an adult or child. No smoking history.   Needing help with citizenship. She does not want her daughter helping since she "is a child and has no business with that."    Review of Systems  Constitutional:  Negative for chills, fever, malaise/fatigue and weight loss.  Respiratory:  Negative for cough, sputum production and shortness of breath.   Cardiovascular: Negative.   Gastrointestinal:  Negative for abdominal pain, diarrhea and vomiting.   Musculoskeletal:  Negative for joint pain, myalgias and neck pain.  Skin:  Positive for rash (finger and nail pain as described above).  Neurological:  Negative for headaches.  Psychiatric/Behavioral:  Negative for depression and substance abuse. The patient is not nervous/anxious.    Past Medical History:  Diagnosis Date   HIV (human immunodeficiency virus infection) ([place])    HIV (human immunodeficiency virus infection) ([place]) 11/30/2018   Dx 2006 HLA neg Quantiferon neg Hep B sAg neg      Outpatient Medications Prior to Visit  Medication Sig Dispense Refill   bictegravir-emtricitabine-tenofovir AF (BIKTARVY) 50-200-25 MG TABS tablet Take 1 tablet by mouth daily. 30 tablet 2   ondansetron (ZOFRAN) 4 MG tablet Take 1 tablet (4 mg total) by mouth every 8 (eight) hours as needed for nausea or vomiting. 12 tablet 0   tiZANidine (ZANAFLEX) 4 MG tablet Take 1 tablet (4 mg total) by mouth every 8 (eight) hours as needed. 30 tablet 0   acetaminophen (TYLENOL) 325 MG tablet Take 2 tablets (650 mg total) by mouth every 6 (six) hours as needed. (Patient not taking: Reported on 10/10/2021)     bacitracin ointment Apply 1 application topically 2 (two) times daily. (Patient not taking: Reported on 04/14/2021) 120 g 0   diclofenac Sodium (VOLTAREN) 1 % GEL Apply 4 g topically 4 (four) times daily. (Patient not taking: Reported on 04/14/2021) 150 g 0   Elastic Bandages & Supports (COMFORT FIT MATERNITY SUPP MED) MISC Wear daily when ambulating (Patient  not taking: Reported on 04/14/2021) 1 each 0   No facility-administered medications prior to visit.     No Known Allergies  Social History   Tobacco Use   Smoking status: Never   Smokeless tobacco: Never  Vaping Use   Vaping Use: Never used  Substance Use Topics   Alcohol use: Not Currently   Drug use: Not Currently    Social History   Substance and Sexual Activity  Sexual Activity Yes   Birth control/protection: None     Objective:    Vitals:   10/10/21 1025  BP: 129/81  Pulse: (!) 43  Temp: 97.8 F (36.6 C)  TempSrc: Oral  SpO2: 99%  Weight: 193 lb 9.6 oz (87.8 kg)   Body mass index is 31.73 kg/m.    Physical Exam HENT:     Mouth/Throat:     Mouth: No oral lesions.     Dentition: No dental abscesses.  Cardiovascular:     Rate and Rhythm: Normal rate and regular rhythm.     Heart sounds: Normal heart sounds.  Pulmonary:     Effort: Pulmonary effort is normal.     Breath sounds: Normal breath sounds.  Abdominal:     General: There is no distension.     Palpations: Abdomen is soft.     Tenderness: There is no abdominal tenderness.  Musculoskeletal:        General: No tenderness. Normal range of motion.  Lymphadenopathy:     Cervical: No cervical adenopathy.  Skin:    General: Skin is warm and dry.     Findings: No rash.  Neurological:     Mental Status: She is alert and oriented to person, place, and time.  Psychiatric:        Judgment: Judgment normal.      Lab Results Lab Results  Component Value Date   WBC 3.4 (L) 08/18/2021   HGB 12.5 08/18/2021   HCT 37.5 08/18/2021   MCV 88.7 08/18/2021   PLT 234 08/18/2021    Lab Results  Component Value Date   CREATININE 0.83 08/18/2021   BUN 14 08/18/2021   NA 141 08/18/2021   K 4.3 08/18/2021   CL 107 08/18/2021   CO2 26 08/18/2021    Lab Results  Component Value Date   ALT 17 08/18/2021   AST 13 08/18/2021   BILITOT 0.4 08/18/2021    Lab Results  Component Value Date   CHOL 156 08/18/2021   HDL 57 08/18/2021   LDLCALC 89 08/18/2021   TRIG 35 08/18/2021   CHOLHDL 2.7 08/18/2021   HIV 1 RNA Quant (Copies/mL)  Date Value  08/18/2021 28 (H)  05/08/2021 Not Detected  11/19/2020 <20 (H)   CD4 T Cell Abs (/uL)  Date Value  08/18/2021 830  11/19/2020 815  07/03/2020 1,099     Assessment & Plan:   Problem List Items Addressed This Visit   None Janene Madeira, MSN, NP-C [place] [place] Pager: [phone] Office: [phone]   Total Encounter: 70 minutes  10/10/21  10:36 AM

## 2021-10-10 NOTE — Patient Instructions (Addendum)
For [employer] Go to:   [employer] - Immigration and Refugee Program ([place] Office) Location: [address] [place], [place] [address]  Give them the letter to have them call if they can so we can help you get what you need.

## 2021-10-13 ENCOUNTER — Other Ambulatory Visit: Payer: Self-pay | Admitting: Infectious Diseases

## 2021-11-08 DIAGNOSIS — M20001 Unspecified deformity of right finger(s): Secondary | ICD-10-CM | POA: Diagnosis not present

## 2021-11-08 DIAGNOSIS — Z6830 Body mass index (BMI) 30.0-30.9, adult: Secondary | ICD-10-CM | POA: Diagnosis not present

## 2021-11-08 DIAGNOSIS — B351 Tinea unguium: Secondary | ICD-10-CM | POA: Diagnosis not present

## 2021-11-08 DIAGNOSIS — Z789 Other specified health status: Secondary | ICD-10-CM | POA: Diagnosis not present

## 2021-12-05 DIAGNOSIS — L03011 Cellulitis of right finger: Secondary | ICD-10-CM | POA: Diagnosis not present

## 2022-01-08 ENCOUNTER — Other Ambulatory Visit: Payer: Self-pay

## 2022-01-08 ENCOUNTER — Encounter: Payer: Self-pay | Admitting: Infectious Diseases

## 2022-01-08 ENCOUNTER — Ambulatory Visit (INDEPENDENT_AMBULATORY_CARE_PROVIDER_SITE_OTHER): Payer: Medicaid Other | Admitting: Infectious Diseases

## 2022-01-08 VITALS — BP 135/86 | HR 68 | Temp 97.1°F | Resp 16 | Ht 65.5 in | Wt 191.6 lb

## 2022-01-08 DIAGNOSIS — R0602 Shortness of breath: Secondary | ICD-10-CM | POA: Insufficient documentation

## 2022-01-08 NOTE — Progress Notes (Signed)
Spent 1 hr and 15 min trying to get [place] interpretation services through [employer]  ? ?  ?Reference # [id number] ?Access Code: [phone]  ? ? ?Unsuccessful unfortunately again to complete the visit  ? ?No charge and patient will be rescheduled  ?

## 2022-01-08 NOTE — Assessment & Plan Note (Signed)
Rash noted on biktarvy - will switch back to [facility]. Counseled regarding medication administration with full chewable meal, expected lead in side effects, med interactions with antacids/ppis.  ?FU in 30m  ?

## 2022-01-08 NOTE — Assessment & Plan Note (Signed)
Unclear as to cause. Lung exam normal. Will check CXR and trial albuterol inhaler for this. ? ?

## 2022-01-23 ENCOUNTER — Ambulatory Visit (INDEPENDENT_AMBULATORY_CARE_PROVIDER_SITE_OTHER): Payer: Medicaid Other | Admitting: Infectious Diseases

## 2022-01-23 ENCOUNTER — Other Ambulatory Visit: Payer: Self-pay

## 2022-01-23 ENCOUNTER — Encounter: Payer: Self-pay | Admitting: Infectious Diseases

## 2022-01-23 VITALS — BP 121/77 | HR 55 | Temp 98.3°F | Wt 192.0 lb

## 2022-01-23 DIAGNOSIS — Z789 Other specified health status: Secondary | ICD-10-CM | POA: Diagnosis not present

## 2022-01-23 DIAGNOSIS — Z758 Other problems related to medical facilities and other health care: Secondary | ICD-10-CM

## 2022-01-23 DIAGNOSIS — Z0000 Encounter for general adult medical examination without abnormal findings: Secondary | ICD-10-CM

## 2022-01-23 DIAGNOSIS — B20 Human immunodeficiency virus [HIV] disease: Secondary | ICD-10-CM

## 2022-01-23 DIAGNOSIS — N912 Amenorrhea, unspecified: Secondary | ICD-10-CM | POA: Insufficient documentation

## 2022-01-23 LAB — POCT URINE PREGNANCY: Preg Test, Ur: NEGATIVE

## 2022-01-23 NOTE — Progress Notes (Signed)
? ?Name: Marcia Long  ?DOB: [birth year] ?MRN: [id number] ?PCP: Patient, No Pcp Per (Inactive)   ? ? ?Brief Narrative:  ?Marcia Long is a 41 y.o. female with HIV disease, dx 2006. Immigrated from [place] [phone] as refugee. Native language [place]. In care at [facility] clinic previously. Married, husband HIV+.  ?History of OIs: unknown. ? ?Previous Regimens: ?Odefsey 2017 >> undetectable ?Tivicay + Truvada during pregnancy 2019/2020 ?Tivicay + Descovy 03-2019 ?Biktarvy 2022 >> rash ?Odefsey 2022 ? ?Genotypes: ?2017 chart records indicate no major RT, PI, INSTI mutations ? ? ?Subjective:  ? ?Chief Complaint  ?Patient presents with  ? Follow-up  ?  ? ? ?HPI:  ?Fortunately the telephone interpretor is available today.  ? ?Primary concern and need today is assistance with citizenship process. Previously was in communication with [facility] with previous CSW [place] and Volney Presser with last contact attempt 11 months ago. She has multiple text threads that have not had any responses. She is not sure where to go from here.  ?She can use [place] and daughter can help with emails/texts.  ? ?Doing well on Odefsey once daily without any complaints. Taking with food everyday. Does not miss her medications every. She says her husband also has been getting them regularly and she is appreciative of the help we have arranged for her.  ? ?She has not had her menstrual cycle in 3 months. Prior to this it was normal. She states she had a tubal ligation with her last delivery. No night sweats, or vaginal symptoms  ? ? ?Review of Systems  ?Constitutional:  Negative for chills, fever, malaise/fatigue and weight loss.  ?HENT:  Negative for sore throat.   ?Respiratory:  Negative for cough, sputum production and shortness of breath.   ?Cardiovascular: Negative.   ?Gastrointestinal:  Negative for abdominal pain, diarrhea and vomiting.  ?Genitourinary:   ?     No menstrual cycle x 58m  ?Musculoskeletal:   Negative for joint pain, myalgias and neck pain.  ?Skin:  Negative for rash.  ?Neurological:  Negative for headaches.  ?Psychiatric/Behavioral:  Negative for depression and substance abuse. The patient is not nervous/anxious.   ? ?Past Medical History:  ?Diagnosis Date  ? HIV (human immunodeficiency virus infection) ([place])   ? HIV (human immunodeficiency virus infection) ([place]) 11/30/2018  ? Dx 2006 HLA neg Quantiferon neg Hep B sAg neg    ? ? ?Outpatient Medications Prior to Visit  ?Medication Sig Dispense Refill  ? albuterol (VENTOLIN HFA) 108 (90 Base) MCG/ACT inhaler Inhale 1-2 puffs into the lungs every 6 (six) hours as needed for wheezing or shortness of breath. 18 g 5  ? emtricitabine-rilpivir-tenofovir AF (ODEFSEY) 200-25-25 MG TABS tablet Take 1 tablet by mouth daily with breakfast. Try to take at the same time each day with a full meal 30 tablet 5  ? acetaminophen (TYLENOL) 325 MG tablet Take 2 tablets (650 mg total) by mouth every 6 (six) hours as needed. (Patient not taking: Reported on 10/10/2021)    ? diclofenac Sodium (VOLTAREN) 1 % GEL Apply 4 g topically 4 (four) times daily. (Patient not taking: Reported on 04/14/2021) 150 g 0  ? ondansetron (ZOFRAN) 4 MG tablet Take 1 tablet (4 mg total) by mouth every 8 (eight) hours as needed for nausea or vomiting. (Patient not taking: Reported on 01/23/2022) 12 tablet 0  ? tiZANidine (ZANAFLEX) 4 MG tablet Take 1 tablet (4 mg total) by mouth every 8 (eight) hours as needed. (Patient  not taking: Reported on 01/23/2022) 30 tablet 0  ? ?No facility-administered medications prior to visit.  ?  ? ?Allergies  ?Allergen Reactions  ? Bictegravir Rash  ? ? ?Social History  ? ?Tobacco Use  ? Smoking status: Never  ? Smokeless tobacco: Never  ?Vaping Use  ? Vaping Use: Never used  ?Substance Use Topics  ? Alcohol use: Not Currently  ? Drug use: Not Currently  ? ? ?Social History  ? ?Substance and Sexual Activity  ?Sexual Activity Yes  ? Birth control/protection: None   ? ? ? ?Objective:  ? ?Vitals:  ? 01/23/22 1349  ?BP: 121/77  ?Pulse: (!) 55  ?Temp: 98.3 ?F (36.8 ?C)  ?TempSrc: Oral  ?SpO2: 99%  ?Weight: 192 lb (87.1 kg)  ? ?Body mass index is 31.46 kg/m?. ? ? ? ?Physical Exam ?Vitals reviewed.  ?Constitutional:   ?   Appearance: She is well-developed.  ?   Comments: Seated comfortably in chair.   ?HENT:  ?   Mouth/Throat:  ?   Mouth: No oral lesions.  ?   Dentition: Normal dentition. No dental abscesses.  ?   Pharynx: No oropharyngeal exudate.  ?Cardiovascular:  ?   Rate and Rhythm: Normal rate and regular rhythm.  ?   Heart sounds: Normal heart sounds.  ?Pulmonary:  ?   Effort: Pulmonary effort is normal.  ?   Breath sounds: Normal breath sounds.  ?Abdominal:  ?   General: There is no distension.  ?   Palpations: Abdomen is soft.  ?   Tenderness: There is no abdominal tenderness.  ?Lymphadenopathy:  ?   Cervical: No cervical adenopathy.  ?Skin: ?   General: Skin is warm and dry.  ?   Findings: No rash.  ?Neurological:  ?   Mental Status: She is alert and oriented to person, place, and time.  ?Psychiatric:     ?   Judgment: Judgment normal.  ?   Comments: In good spirits today and engaged in care discussion  ? ? ? ?Lab Results ?Lab Results  ?Component Value Date  ? WBC 3.4 (L) 08/18/2021  ? HGB 12.5 08/18/2021  ? HCT 37.5 08/18/2021  ? MCV 88.7 08/18/2021  ? PLT 234 08/18/2021  ?  ?Lab Results  ?Component Value Date  ? CREATININE 0.83 08/18/2021  ? BUN 14 08/18/2021  ? NA 141 08/18/2021  ? K 4.3 08/18/2021  ? CL 107 08/18/2021  ? CO2 26 08/18/2021  ?  ?Lab Results  ?Component Value Date  ? ALT 17 08/18/2021  ? AST 13 08/18/2021  ? BILITOT 0.4 08/18/2021  ?  ?Lab Results  ?Component Value Date  ? CHOL 156 08/18/2021  ? HDL 57 08/18/2021  ? [place] 89 08/18/2021  ? TRIG 35 08/18/2021  ? CHOLHDL 2.7 08/18/2021  ? ?HIV 1 RNA Quant (Copies/mL)  ?Date Value  ?08/18/2021 28 (H)  ?05/08/2021 Not Detected  ?11/19/2020 <20 (H)  ? ?CD4 T Cell Abs (/uL)  ?Date Value  ?08/18/2021 830   ?11/19/2020 815  ?07/03/2020 1,099  ? ? ? ?Assessment & Plan:  ? ?Problem List Items Addressed This Visit   ? ?  ? High  ? Human immunodeficiency virus (HIV) disease ([place]) - Primary (Chronic)  ?  Well controlled on once daily odefsey. She is taking this correctly and has access to her medication. Will update labs today.  ?RTC in 23mto help with other medical and social concerns.  ?Pap smear is up to date - due next 2025 ?  Vaccines up to date.  ?  ?  ? Relevant Orders  ? HIV-1 RNA quant-no reflex-bld  ? T-helper cells (CD4) count (not at [facility])  ?  ? Unprioritized  ? Amenorrhea  ?  Urine pregnancy test negative. No vasomotor symptoms.  ?She will continue to monitor these. If persistent will check TSH, FSH and A1C ?  ?  ? Relevant Orders  ? POCT urine pregnancy (Completed)  ? Healthcare maintenance  ?  Needs prevnar 20 at upcoming visit for most up to date pneumococcal recs.  ?Cervical cancer screening due 2025  ?  ?  ? Language barrier  ?  Total encounter time - 67 minutes spent in attempt with face to face discussion and personal attempts to coordinate citizenship with multiple phone calls and emails to [employer], multiple case Publishing rights manager and [employer]. The latter was the most fruitful and I am hopeful we can get her the help she needs. She uses Whatsapp to help with communication when she can and her daughter to supplement the rest (though she does not like to have daughter come during health visits given she is a minor).  ?She will go to one day next week.  ?  ?  ? ? ?Janene Madeira, MSN, NP-C ?[place] for Infectious Disease ?[facility] ?Pager: [phone] ?Office: [phone] ? ? ?Total Encounter: 67 minutes spent with interpretor for the above.  ? ?01/23/22  ?8:41 PM ? ? ?

## 2022-01-23 NOTE — Assessment & Plan Note (Addendum)
Needs prevnar 20 at upcoming visit for most up to date pneumococcal recs.  ?Cervical cancer screening due 2025  ?

## 2022-01-23 NOTE — Assessment & Plan Note (Signed)
Urine pregnancy test negative. No vasomotor symptoms.  ?She will continue to monitor these. If persistent will check TSH, FSH and A1C ?

## 2022-01-23 NOTE — Assessment & Plan Note (Signed)
Well controlled on once daily odefsey. She is taking this correctly and has access to her medication. Will update labs today.  ?RTC in 89m to help with other medical and social concerns.  ?Pap smear is up to date - due next 2025 ?Vaccines up to date.  ?

## 2022-01-23 NOTE — Assessment & Plan Note (Signed)
Total encounter time - 67 minutes spent in attempt with face to face discussion and personal attempts to coordinate citizenship with multiple phone calls and emails to [employer], multiple case Teaching laboratory technician and [employer]. The latter was the most fruitful and I am hopeful we can get her the help she needs. She uses Whatsapp to help with communication when she can and her daughter to supplement the rest (though she does not like to have daughter come during health visits given she is a minor).  ?She will go to one day next week.  ?

## 2022-01-23 NOTE — Patient Instructions (Addendum)
Nice to see you today.  ? ?I am going to email the coordinator at the [facility] and include you as well so we can try to help with citizenship  ? ?New Arrivals Citizenship  ?It is located at [place]  ?[address] [place], [place], [place] ?[phone] ?Citizenship Financial planner at [email or web address] or call at [phone]. ? ?Marcia Long - Go to new arrivals church on Tuesday and Wednesday 9:30 - 1:00 pm.  ?Look for the signs for New Arrivals  ? ?Would like to see you again in 6 months to check in with you ?

## 2022-01-26 LAB — HIV-1 RNA QUANT-NO REFLEX-BLD
HIV 1 RNA Quant: <20 Copies/mL — ABNORMAL HIGH
HIV-1 RNA Quant, Log: <1.3 Log cps/mL — ABNORMAL HIGH

## 2022-01-26 LAB — T-HELPER CELLS (CD4) COUNT (NOT AT ARMC)
Absolute CD4: 976 cells/uL (ref 490–1740)
CD4 T Helper %: 50 % (ref 30–61)
Total lymphocyte count: 1964 cells/uL (ref 850–3900)

## 2022-01-27 NOTE — Congregational Nurse Program (Signed)
?  Dept: [phone] ? ? ?Congregational Nurse Program Note ? ?Date of Encounter: 01/27/2022 ? ?Past Medical History: ?Past Medical History:  ?Diagnosis Date  ? HIV (human immunodeficiency virus infection) ([place])   ? HIV (human immunodeficiency virus infection) ([place]) 11/30/2018  ? Dx 2006 HLA neg Quantiferon neg Hep B sAg neg    ? ? ?Encounter Details: ? CNP Questionnaire - 01/27/22 1149   ? ?  ? Questionnaire  ? Location Patient Served  [facility]   ? Visit Setting Church or Organization   ? Patient Status Refugee   ? Insurance Medicaid   ? Insurance Referral N/A   ? Medication N/A   ? Medical Provider No   ? Screening Referrals N/A   ? Medical Referral Other   ? Medical Appointment Made N/A   ? Food N/A   ? Transportation N/A   ? Housing/Utilities N/A   ? Interpersonal Safety N/A   ? Intervention Advocate;Case Management;Counsel;Educate;Support   ? ED Visit Averted N/A   ? ?  ?  ? ?  ? ?Patient is requesting assistance to register for Citizenship classes. I have also informed her of drivers Ed offered here. She is interested in both. Information on registration provided.Telephone numbers to text the instructors provided.Patient grateful for the information. ? ?Honor Loh RN BSn PCCN  ?[place] Nurse ?[phone] ?[phone]-office ? ? ? ?

## 2022-02-19 ENCOUNTER — Encounter (HOSPITAL_COMMUNITY): Payer: Self-pay

## 2022-02-19 ENCOUNTER — Ambulatory Visit (HOSPITAL_COMMUNITY)
Admission: EM | Admit: 2022-02-19 | Discharge: 2022-02-19 | Disposition: A | Discharge status: Home / Self Care | Payer: Medicaid Other | Attending: Physician Assistant | Admitting: Physician Assistant

## 2022-02-19 DIAGNOSIS — J020 Streptococcal pharyngitis: Secondary | ICD-10-CM

## 2022-02-19 LAB — POCT INFECTIOUS MONO SCREEN, ED / UC: Mono Screen: NEGATIVE

## 2022-02-19 LAB — POCT RAPID STREP A, ED / UC: Streptococcus, Group A Screen (Direct): POSITIVE — AB

## 2022-02-19 MED ORDER — AMOXICILLIN 500 MG PO CAPS: 500.0000 mg | ORAL_CAPSULE | Freq: Two times a day (BID) | ORAL | 0 refills | Status: DC

## 2022-02-19 NOTE — ED Triage Notes (Signed)
Pt c/o swelling to lt side of neck x3 days. States pain on eating or drinking. No distress noted.  ?

## 2022-02-19 NOTE — Discharge Instructions (Signed)
You tested positive for strep throat.  Please take amoxicillin 500 mg twice daily for 10 days.  Gargle with warm salt water and use Tylenol for pain relief.  Make sure to dispose of your toothbrush soon after starting medication to prevent reinfection.  You are contagious for 24 hours after starting medicine.  If anything worsens and you have high fever, worsening pain, difficulty swallowing, shortness of breath, muffled voice you need to go to the emergency room immediately. ?

## 2022-02-19 NOTE — ED Provider Notes (Signed)
?[facility] ? ? ? ?CSN: [id number] ?Arrival date & time: 02/19/22  0808 ? ? ?  ? ?History   ?Chief Complaint ?Chief Complaint  ?Patient presents with  ? neck swelling   ? ? ?HPI ?Marcia Long is a 41 y.o. female.  ? ?Patient resents today with a 3-day history of left-sided throat pain.  She is accompanied by her daughter who provided translation.  Reports pain is rated 10 on a 0-10 pain scale, described as sharp, worse with swallowing or palpation, no alleviating factors identified.  She has not tried any over-the-counter medication for symptom management.  She does report chills and subjective fever but denies any measured fever.  Denies significant cough, congestion, chest pain, shortness of breath, nausea, vomiting.  Denies any known sick contacts.  She denies any recent antibiotic use.  Denies history of thyroid disease.  She does have a history of HIV with normal CD4 counts several weeks ago but detectable HIV virus.  She is taking antiviral therapy as prescribed. ? ? ?Past Medical History:  ?Diagnosis Date  ? HIV (human immunodeficiency virus infection) ([place])   ? HIV (human immunodeficiency virus infection) ([place]) 11/30/2018  ? Dx 2006 HLA neg Quantiferon neg Hep B sAg neg    ? ? ?Patient Active Problem List  ? Diagnosis Date Noted  ? Amenorrhea 01/23/2022  ? Paronychia of right ring finger 12/12/2020  ? Screening for cervical cancer 12/12/2020  ? Elevated glucose level 08/14/2019  ? Healthcare maintenance 04/23/2019  ? Human immunodeficiency virus (HIV) disease ([place]) 11/30/2018  ? Chronic dental pain 11/30/2018  ? Language barrier 11/03/2018  ? ? ?Past Surgical History:  ?Procedure Laterality Date  ? CESAREAN SECTION N/A 02/24/2019  ? Procedure: CESAREAN SECTION;  Surgeon: Aletha Halim, MD;  Location: [facility];  Service: Obstetrics;  Laterality: N/A;  ? NO PAST SURGERIES    ? ? ?OB History   ? ? Gravida  ?6  ? Para  ?6  ? Term  ?6  ? Preterm  ?0  ? AB  ?0  ? Living  ?6  ?  ? ? SAB  ?0  ? IAB  ?0   ? Ectopic  ?0  ? Multiple  ?0  ? Live Births  ?6  ?   ?  ?  ? ? ? ?Home Medications   ? ?Prior to Admission medications   ?Medication Sig Start Date End Date Taking? Authorizing Provider  ?amoxicillin (AMOXIL) 500 MG capsule Take 1 capsule (500 mg total) by mouth 2 (two) times daily. 02/19/22  Yes Tamarah Bhullar, Derry Skill, PA-C  ?acetaminophen (TYLENOL) 325 MG tablet Take 2 tablets (650 mg total) by mouth every 6 (six) hours as needed. ?Patient not taking: Reported on 10/10/2021 02/27/21   Chase Picket, MD  ?albuterol (VENTOLIN HFA) 108 (90 Base) MCG/ACT inhaler Inhale 1-2 puffs into the lungs every 6 (six) hours as needed for wheezing or shortness of breath. 10/10/21   Linden Callas, NP  ?diclofenac Sodium (VOLTAREN) 1 % GEL Apply 4 g topically 4 (four) times daily. ?Patient not taking: Reported on 04/14/2021 02/27/21   Chase Picket, MD  ?emtricitabine-rilpivir-tenofovir AF (ODEFSEY) 200-25-25 MG TABS tablet Take 1 tablet by mouth daily with breakfast. Try to take at the same time each day with a full meal 10/10/21   [place] Callas, NP  ?ondansetron (ZOFRAN) 4 MG tablet Take 1 tablet (4 mg total) by mouth every 8 (eight) hours as needed for nausea or vomiting. ?Patient  not taking: Reported on 01/23/2022 04/15/21   Pearson Forster, NP  ?tiZANidine (ZANAFLEX) 4 MG tablet Take 1 tablet (4 mg total) by mouth every 8 (eight) hours as needed. ?Patient not taking: Reported on 01/23/2022 06/05/20   Jaynee Eagles, PA-C  ? ? ?Family History ?History reviewed. No pertinent family history. ? ?Social History ?Social History  ? ?Tobacco Use  ? Smoking status: Never  ? Smokeless tobacco: Never  ?Vaping Use  ? Vaping Use: Never used  ?Substance Use Topics  ? Alcohol use: Not Currently  ? Drug use: Not Currently  ? ? ? ?Allergies   ?Bictegravir ? ? ?Review of Systems ?Review of Systems  ?Constitutional:  Positive for activity change, chills and fever. Negative for appetite change and fatigue.  ?HENT:  Positive for sore throat and  trouble swallowing. Negative for congestion, sinus pressure, sneezing and voice change.   ?Respiratory:  Negative for cough and shortness of breath.   ?Cardiovascular:  Negative for chest pain.  ?Gastrointestinal:  Negative for abdominal pain, diarrhea, nausea and vomiting.  ?Neurological:  Negative for dizziness, light-headedness and headaches.  ? ? ?Physical Exam ?Triage Vital Signs ?ED Triage Vitals  ?Enc Vitals Group  ?   BP 02/19/22 0841 118/76  ?   Pulse Rate 02/19/22 0841 76  ?   Resp 02/19/22 0841 18  ?   Temp 02/19/22 0841 98.7 ?F (37.1 ?C)  ?   Temp Source 02/19/22 0841 Oral  ?   SpO2 02/19/22 0841 98 %  ?   Weight --   ?   Height --   ?   Head Circumference --   ?   Peak Flow --   ?   Pain Score 02/19/22 0842 10  ?   Pain Loc --   ?   Pain Edu? --   ?   Excl. in [place]? --   ? ?No data found. ? ?Updated Vital Signs ?BP 118/76 (BP Location: Left Arm)   Pulse 76   Temp 98.7 ?F (37.1 ?C) (Oral)   Resp 18   LMP  (LMP Unknown) Comment: states last menstrual cycle 4 months ago.  SpO2 98%  ? ?Visual Acuity ?Right Eye Distance:   ?Left Eye Distance:   ?Bilateral Distance:   ? ?Right Eye Near:   ?Left Eye Near:    ?Bilateral Near:    ? ?Physical Exam ?Vitals reviewed.  ?Constitutional:   ?   General: She is awake. She is not in acute distress. ?   Appearance: Normal appearance. She is well-developed. She is not ill-appearing.  ?   Comments: Very pleasant female appears stated age in no acute distress sitting comfortably in exam room  ?HENT:  ?   Head: Normocephalic and atraumatic.  ?   Right Ear: Tympanic membrane, ear canal and external ear normal. Tympanic membrane is not erythematous or bulging.  ?   Left Ear: Tympanic membrane, ear canal and external ear normal. Tympanic membrane is not erythematous or bulging.  ?   Nose:  ?   Right Sinus: No maxillary sinus tenderness or frontal sinus tenderness.  ?   Left Sinus: No maxillary sinus tenderness or frontal sinus tenderness.  ?   Mouth/Throat:  ?   Pharynx:  Uvula midline. Posterior oropharyngeal erythema present. No oropharyngeal exudate.  ?   Tonsils: No tonsillar exudate or tonsillar abscesses.  ?Cardiovascular:  ?   Rate and Rhythm: Normal rate and regular rhythm.  ?   Heart sounds: Normal heart sounds,  S1 normal and S2 normal. No murmur heard. ?Pulmonary:  ?   Effort: Pulmonary effort is normal.  ?   Breath sounds: Normal breath sounds. No wheezing, rhonchi or rales.  ?   Comments: Clear to auscultation bilaterally ?Lymphadenopathy:  ?   Head:  ?   Right side of head: No submental, submandibular or tonsillar adenopathy.  ?   Left side of head: Submandibular adenopathy present. No submental or tonsillar adenopathy.  ?   Cervical: No cervical adenopathy.  ?Psychiatric:     ?   Behavior: Behavior is cooperative.  ? ? ? ?UC Treatments / Results  ?Labs ?(all labs ordered are listed, but only abnormal results are displayed) ?Labs Reviewed  ?POCT RAPID STREP A, ED / UC - Abnormal; Notable for the following components:  ?    Result Value  ? Streptococcus, Group A Screen (Direct) POSITIVE (*)   ? All other components within normal limits  ?POCT INFECTIOUS MONO SCREEN, ED / UC  ? ? ?EKG ? ? ?Radiology ?No results found. ? ?Procedures ?Procedures (including critical care time) ? ?Medications Ordered in UC ?Medications - No data to display ? ?Initial Impression / Assessment and Plan / UC Course  ?I have reviewed the triage vital signs and the nursing notes. ? ?Pertinent labs & imaging results that were available during my care of the patient were reviewed by me and considered in my medical decision making (see chart for details). ? ?  ? ?Patient tested positive for strep pharyngitis.  She is started on amoxicillin 500 mg twice daily for 10 days.  Discussed that she should dispose of her toothbrush a few days after starting medication to prevent reinfection.  She is to gargle with warm salt water and use Tylenol for pain relief.  Discussed that she is contagious for 24 hours  after starting medication and was provided a work excuse note.  If she has any worsening symptoms including high fever not responding to medication, difficulty swallowing, muffled voice, swelling of her throat, shor

## 2022-03-06 DIAGNOSIS — L03011 Cellulitis of right finger: Secondary | ICD-10-CM | POA: Diagnosis not present

## 2022-03-11 ENCOUNTER — Encounter (HOSPITAL_COMMUNITY): Payer: Self-pay

## 2022-03-11 ENCOUNTER — Ambulatory Visit (HOSPITAL_COMMUNITY)
Admission: EM | Admit: 2022-03-11 | Discharge: 2022-03-11 | Disposition: A | Discharge status: Home / Self Care | Payer: Medicaid Other | Attending: Physician Assistant | Admitting: Physician Assistant

## 2022-03-11 DIAGNOSIS — E0789 Other specified disorders of thyroid: Secondary | ICD-10-CM | POA: Diagnosis not present

## 2022-03-11 DIAGNOSIS — R1310 Dysphagia, unspecified: Secondary | ICD-10-CM | POA: Insufficient documentation

## 2022-03-11 DIAGNOSIS — R1319 Other dysphagia: Secondary | ICD-10-CM | POA: Diagnosis not present

## 2022-03-11 LAB — CBC WITH DIFFERENTIAL/PLATELET
Abs Immature Granulocytes: 0.01 10*3/uL (ref 0.00–0.07)
Basophils Absolute: 0 10*3/uL (ref 0.0–0.1)
Basophils Relative: 1 %
Eosinophils Absolute: 0.3 10*3/uL (ref 0.0–0.5)
Eosinophils Relative: 5 %
HCT: 38 % (ref 36.0–46.0)
Hemoglobin: 12.5 g/dL (ref 12.0–15.0)
Immature Granulocytes: 0 %
Lymphocytes Relative: 44 %
Lymphs Abs: 2.2 10*3/uL (ref 0.7–4.0)
MCH: 29 pg (ref 26.0–34.0)
MCHC: 32.9 g/dL (ref 30.0–36.0)
MCV: 88.2 fL (ref 80.0–100.0)
Monocytes Absolute: 0.5 10*3/uL (ref 0.1–1.0)
Monocytes Relative: 10 %
Neutro Abs: 2.1 10*3/uL (ref 1.7–7.7)
Neutrophils Relative %: 40 %
Platelets: 338 10*3/uL (ref 150–400)
RBC: 4.31 MIL/uL (ref 3.87–5.11)
RDW: 13.4 % (ref 11.5–15.5)
WBC: 5.1 10*3/uL (ref 4.0–10.5)
nRBC: 0 % (ref 0.0–0.2)

## 2022-03-11 LAB — COMPREHENSIVE METABOLIC PANEL
ALT: 25 U/L (ref 0–44)
AST: 19 U/L (ref 15–41)
Albumin: 3.7 g/dL (ref 3.5–5.0)
Alkaline Phosphatase: 63 U/L (ref 38–126)
Anion gap: 8 (ref 5–15)
BUN: 10 mg/dL (ref 6–20)
CO2: 26 mmol/L (ref 22–32)
Calcium: 9.2 mg/dL (ref 8.9–10.3)
Chloride: 105 mmol/L (ref 98–111)
Creatinine, Ser: 0.79 mg/dL (ref 0.44–1.00)
GFR, Estimated: >60 mL/min (ref >60)
Glucose, Bld: 85 mg/dL (ref 70–99)
Potassium: 4.2 mmol/L (ref 3.5–5.1)
Sodium: 139 mmol/L (ref 135–145)
Total Bilirubin: 0.6 mg/dL (ref 0.3–1.2)
Total Protein: 7.7 g/dL (ref 6.5–8.1)

## 2022-03-11 LAB — TSH: TSH: 0.57 u[IU]/mL (ref 0.350–4.500)

## 2022-03-11 MED ORDER — IBUPROFEN 600 MG PO TABS: 600.0000 mg | ORAL_TABLET | Freq: Four times a day (QID) | ORAL | 0 refills | Status: AC | PRN

## 2022-03-11 NOTE — ED Provider Notes (Signed)
?[facility] [place] ? ? ? ?CSN: [id number] ?Arrival date & time: 03/11/22  1200 ? ? ?  ? ?History   ?Chief Complaint ?Chief Complaint  ?Patient presents with  ? throat pain  ? ? ?HPI ?Marcia Long is a 41 y.o. female.  ? ?70-year-old female presents with throat pain.  Patient does not speak English her language is [place].  Interpretation is being done through her sister on the phone.  Patient relates for the past 2 months she has been having progressive throat pain, painful swallowing.  Patient indicates that it feels like her throat is sore, feels like there is a mass growing in her throat.  Patient relates she can swallow liquids without any difficulty.  She does occasionally have problems swallowing solids.  She is not having any problems with abdominal pain, no nausea or vomiting.  No fever or chills.  When asked where the pain is located the patient points to just above her thyroid on both sides.  She presently desires to have her discomfort evaluated. ? ? ? ?Past Medical History:  ?Diagnosis Date  ? HIV (human immunodeficiency virus infection) ([place])   ? HIV (human immunodeficiency virus infection) ([place]) 11/30/2018  ? Dx 2006 HLA neg Quantiferon neg Hep B sAg neg    ? ? ?Patient Active Problem List  ? Diagnosis Date Noted  ? Amenorrhea 01/23/2022  ? Paronychia of right ring finger 12/12/2020  ? Screening for cervical cancer 12/12/2020  ? Elevated glucose level 08/14/2019  ? Healthcare maintenance 04/23/2019  ? Human immunodeficiency virus (HIV) disease ([place]) 11/30/2018  ? Chronic dental pain 11/30/2018  ? Language barrier 11/03/2018  ? ? ?Past Surgical History:  ?Procedure Laterality Date  ? CESAREAN SECTION N/A 02/24/2019  ? Procedure: CESAREAN SECTION;  Surgeon: Aletha Halim, MD;  Location: [facility] LD [facility]S;  Service: Obstetrics;  Laterality: N/A;  ? NO PAST SURGERIES    ? ? ?OB History   ? ? Gravida  ?6  ? Para  ?6  ? Term  ?6  ? Preterm  ?0  ? AB  ?0  ? Living  ?6  ?  ? ? SAB  ?0  ? IAB  ?0  ? Ectopic  ?0  ?  Multiple  ?0  ? Live Births  ?6  ?   ?  ?  ? ? ? ?Home Medications   ? ?Prior to Admission medications   ?Medication Sig Start Date End Date Taking? Authorizing Provider  ?ibuprofen (ADVIL) 600 MG tablet Take 1 tablet (600 mg total) by mouth every 6 (six) hours as needed. 03/11/22  Yes Nyoka Lint, PA-C  ?acetaminophen (TYLENOL) 325 MG tablet Take 2 tablets (650 mg total) by mouth every 6 (six) hours as needed. ?Patient not taking: Reported on 10/10/2021 02/27/21   Chase Picket, MD  ?albuterol (VENTOLIN HFA) 108 (90 Base) MCG/ACT inhaler Inhale 1-2 puffs into the lungs every 6 (six) hours as needed for wheezing or shortness of breath. 10/10/21   [place] Callas, NP  ?amoxicillin (AMOXIL) 500 MG capsule Take 1 capsule (500 mg total) by mouth 2 (two) times daily. 02/19/22   Raspet, Derry Skill, PA-C  ?diclofenac Sodium (VOLTAREN) 1 % GEL Apply 4 g topically 4 (four) times daily. ?Patient not taking: Reported on 04/14/2021 02/27/21   Chase Picket, MD  ?emtricitabine-rilpivir-tenofovir AF (ODEFSEY) 200-25-25 MG TABS tablet Take 1 tablet by mouth daily with breakfast. Try to take at the same time each day with a full meal 10/10/21  [place] Callas, NP  ?ondansetron (ZOFRAN) 4 MG tablet Take 1 tablet (4 mg total) by mouth every 8 (eight) hours as needed for nausea or vomiting. ?Patient not taking: Reported on 01/23/2022 04/15/21   Pearson Forster, NP  ?tiZANidine (ZANAFLEX) 4 MG tablet Take 1 tablet (4 mg total) by mouth every 8 (eight) hours as needed. ?Patient not taking: Reported on 01/23/2022 06/05/20   Jaynee Eagles, PA-C  ? ? ?Family History ?History reviewed. No pertinent family history. ? ?Social History ?Social History  ? ?Tobacco Use  ? Smoking status: Never  ? Smokeless tobacco: Never  ?Vaping Use  ? Vaping Use: Never used  ?Substance Use Topics  ? Alcohol use: Not Currently  ? Drug use: Not Currently  ? ? ? ?Allergies   ?Bictegravir ? ? ?Review of Systems ?Review of Systems  ?HENT:  Positive for sore throat  (painful swallowing).   ? ? ?Physical Exam ?Triage Vital Signs ?ED Triage Vitals  ?Enc Vitals Group  ?   BP 03/11/22 1349 132/83  ?   Pulse Rate 03/11/22 1349 67  ?   Resp 03/11/22 1349 18  ?   Temp 03/11/22 1349 98 ?F (36.7 ?C)  ?   Temp Source 03/11/22 1349 Oral  ?   SpO2 03/11/22 1349 99 %  ?   Weight --   ?   Height --   ?   Head Circumference --   ?   Peak Flow --   ?   Pain Score 03/11/22 1354 10  ?   Pain Loc --   ?   Pain Edu? --   ?   Excl. in [place]? --   ? ?No data found. ? ?Updated Vital Signs ?BP 132/83 (BP Location: Left Arm)   Pulse 67   Temp 98 ?F (36.7 ?C) (Oral)   Resp 18   LMP  (LMP Unknown) Comment: states last menstrual cycle 4 months ago.  SpO2 99%  ? ?Visual Acuity ?Right Eye Distance:   ?Left Eye Distance:   ?Bilateral Distance:   ? ?Right Eye Near:   ?Left Eye Near:    ?Bilateral Near:    ? ?Physical Exam ?Constitutional:   ?   Appearance: Normal appearance.  ?HENT:  ?   Mouth/Throat:  ?   Mouth: Mucous membranes are moist.  ?   Palate: No mass and lesions.  ?   Pharynx: Oropharynx is clear.  ?Neck:  ?   Comments: Neck: No cervical adenopathy noted bilaterally.  Moderate pain palpated on the right thyroid area.  No nodules or enlargement noted. ?Cardiovascular:  ?   Rate and Rhythm: Normal rate and regular rhythm.  ?   Heart sounds: Normal heart sounds.  ?Pulmonary:  ?   Effort: Pulmonary effort is normal.  ?   Breath sounds: Normal breath sounds and air entry. No wheezing, rhonchi or rales.  ?Abdominal:  ?   General: Abdomen is flat. Bowel sounds are normal.  ?   Palpations: Abdomen is soft.  ?Neurological:  ?   Mental Status: She is alert.  ? ? ? ?UC Treatments / Results  ?Labs ?(all labs ordered are listed, but only abnormal results are displayed) ?Labs Reviewed  ?TSH  ?COMPREHENSIVE METABOLIC PANEL  ?CBC WITH DIFFERENTIAL/PLATELET  ? ? ?EKG ? ? ?Radiology ?No results found. ? ?Procedures ?Procedures (including critical care time) ? ?Medications Ordered in UC ?Medications - No data to  display ? ?Initial Impression / Assessment and Plan / UC Course  ?  I have reviewed the triage vital signs and the nursing notes. ? ?Pertinent labs & imaging results that were available during my care of the patient were reviewed by me and considered in my medical decision making (see chart for details). ? ? ?Plan: ?Patient does not speak [place] and speaks [place] and will need an interpreter. ?Patient will be referred to ENT for evaluation of the throat pain. ?Advised to take the ibuprofen 600 mg every 6 hours as needed for pain. ?Continue with liquids and soft foods. ?Follow-up with PCP if symptoms fail to improve. ?Final Clinical Impressions(s) / UC Diagnoses  ? ?Final diagnoses:  ?Esophageal dysphagia  ?Painful swallowing  ?Painful thyroid  ? ? ? ?Discharge Instructions   ? ?  ?Advised patient to use salt water gargles for pain. ?Advised patient take ibuprofen 600 mg every 8 hours for pain. ? ? ? ? ?ED Prescriptions   ? ? Medication Sig Dispense Auth. Provider  ? ibuprofen (ADVIL) 600 MG tablet Take 1 tablet (600 mg total) by mouth every 6 (six) hours as needed. 30 tablet Nyoka Lint, PA-C  ? ?  ? ?PDMP not reviewed this encounter. ?  ?Nyoka Lint, PA-C ?03/11/22 1424 ? ?

## 2022-03-11 NOTE — Discharge Instructions (Signed)
Advised patient to use salt water gargles for pain. ?Advised patient take ibuprofen 600 mg every 8 hours for pain. ?

## 2022-03-11 NOTE — ED Triage Notes (Signed)
Pt presents today c/o throat pain, pain when she swallows and feeling like there is a wound inside her neck.  ?

## 2022-04-09 ENCOUNTER — Telehealth: Payer: Self-pay

## 2022-04-09 NOTE — Telephone Encounter (Signed)
Received refill request for Ventolin HFA INJ W/DOS CTR 200 puffs inhaler.Will forward message to provider to advise on refill. Juanita Laster, RMA

## 2022-04-10 ENCOUNTER — Other Ambulatory Visit: Payer: Self-pay

## 2022-04-10 MED ORDER — ALBUTEROL SULFATE HFA 108 (90 BASE) MCG/ACT IN AERS: 1.0000 | INHALATION_SPRAY | Freq: Four times a day (QID) | RESPIRATORY_TRACT | 6 refills | Status: DC | PRN

## 2022-05-12 ENCOUNTER — Other Ambulatory Visit: Payer: Self-pay

## 2022-05-12 DIAGNOSIS — B20 Human immunodeficiency virus [HIV] disease: Secondary | ICD-10-CM

## 2022-05-12 DIAGNOSIS — L03011 Cellulitis of right finger: Secondary | ICD-10-CM

## 2022-05-12 MED ORDER — EMTRICITAB-RILPIVIR-TENOFOV AF 200-25-25 MG PO TABS: 1.0000 | ORAL_TABLET | Freq: Every day | ORAL | 2 refills | Status: DC

## 2022-07-21 ENCOUNTER — Other Ambulatory Visit: Payer: Self-pay

## 2022-07-21 ENCOUNTER — Ambulatory Visit (INDEPENDENT_AMBULATORY_CARE_PROVIDER_SITE_OTHER): Payer: Medicaid Other | Admitting: Infectious Diseases

## 2022-07-21 ENCOUNTER — Encounter: Payer: Self-pay | Admitting: Infectious Diseases

## 2022-07-21 VITALS — BP 116/77 | HR 50 | Temp 98.0°F | Wt 193.0 lb

## 2022-07-21 DIAGNOSIS — B20 Human immunodeficiency virus [HIV] disease: Secondary | ICD-10-CM

## 2022-07-21 DIAGNOSIS — G8929 Other chronic pain: Secondary | ICD-10-CM

## 2022-07-21 DIAGNOSIS — Z23 Encounter for immunization: Secondary | ICD-10-CM

## 2022-07-21 DIAGNOSIS — Z0000 Encounter for general adult medical examination without abnormal findings: Secondary | ICD-10-CM

## 2022-07-21 DIAGNOSIS — M79671 Pain in right foot: Secondary | ICD-10-CM | POA: Diagnosis not present

## 2022-07-21 DIAGNOSIS — K089 Disorder of teeth and supporting structures, unspecified: Secondary | ICD-10-CM | POA: Diagnosis not present

## 2022-07-21 MED ORDER — EMTRICITAB-RILPIVIR-TENOFOV AF 200-25-25 MG PO TABS: 1.0000 | ORAL_TABLET | Freq: Every day | ORAL | 11 refills | Status: DC

## 2022-07-21 NOTE — Assessment & Plan Note (Addendum)
Most suspect plantar fasciitis given waxing/waning qualities and flat feet. She has tenderness overlying right heel extending to the midfoot on the Right.   Information provided about stretching maneuvers and rolling foot on frozen water bottle for pain relief 2-3 times a day as she can tolerate. Well fitting and supportive shoes (in sandals all the time) Topical Volteren QID.

## 2022-07-21 NOTE — Patient Instructions (Addendum)
I will give you some allergy medicine to take - one pill once a day to see if this helps your sore throat - I do worry allergies are contributing to the pain. Very very common in this part of [place].  IF this helps - you can get refills at the pharmacy for generic Zyrtec   I would like to get you in to see the [facility] to evaluate your pain - I think it has something to do with your teeth.   Please continue your [place] everyday with food.   For your foot - I think you have Plantar Fasciitis  Put a plastic water bottle in the freezer and when it is ice, roll your foot over it with moderate to heavy pressure for 10 minutes in the morning and 10 minutes in the evening to help with the pain.   Plantar Fasciitis  Plantar fasciitis is a painful foot condition that affects the heel. It occurs when the band of tissue that connects the toes to the heel bone (plantar fascia) becomes irritated. This can happen as the result of exercising too much or doing other repetitive activities (overuse injury). Plantar fasciitis can cause mild irritation to severe pain that makes it difficult to walk or move. The pain is usually worse in the morning after sleeping, or after sitting or lying down for a period of time. Pain may also be worse after long periods of walking or standing. What are the causes? This condition may be caused by: Standing for long periods of time. Wearing shoes that do not have good arch support. Doing activities that put stress on joints (high-impact activities). This includes ballet and exercise that makes your heart beat faster (aerobic exercise), such as running. Being overweight. An abnormal way of walking (gait). Tight muscles in the back of your lower leg (calf). High arches in your feet or flat feet. Starting a new athletic activity. What are the signs or symptoms? The main symptom of this condition is heel pain. Pain may get worse after the following: Taking the first  steps after a time of rest, especially in the morning after awakening, or after you have been sitting or lying down for a while. Long periods of standing still. Pain may decrease after 30-45 minutes of activity, such as gentle walking. How is this diagnosed? This condition may be diagnosed based on your medical history, a physical exam, and your symptoms. Your health care provider will check for: A tender area on the bottom of your foot. A high arch in your foot or flat feet. Pain when you move your foot. Difficulty moving your foot. You may have imaging tests to confirm the diagnosis, such as: X-rays. Ultrasound. MRI. How is this treated? Treatment for plantar fasciitis depends on how severe your condition is. Treatment may include: Rest, ice, pressure (compression), and raising (elevating) the affected foot. This is called RICE therapy. Your health care provider may recommend RICE therapy along with over-the-counter pain medicines to manage your pain. Exercises to stretch your calves and your plantar fascia. A splint that holds your foot in a stretched, upward position while you sleep (night splint). Physical therapy to relieve symptoms and prevent problems in the future. Injections of steroid medicine (cortisone) to relieve pain and inflammation. Stimulating your plantar fascia with electrical impulses (extracorporeal shock wave therapy). This is usually the last treatment option before surgery. Surgery, if other treatments have not worked after 12 months. Follow these instructions at home: Managing pain,  stiffness, and swelling  If directed, put ice on the painful area. To do this: Put ice in a plastic bag, or use a frozen bottle of water. Place a towel between your skin and the bag or bottle. Roll the bottom of your foot over the bag or bottle. Do this for 20 minutes, 2-3 times a day. Wear athletic shoes that have air-sole or gel-sole cushions, or try soft shoe inserts that are  designed for plantar fasciitis. Elevate your foot above the level of your heart while you are sitting or lying down. Activity Avoid activities that cause pain. Ask your health care provider what activities are safe for you. Do physical therapy exercises and stretches as told by your health care provider. Try activities and forms of exercise that are easier on your joints (low impact). Examples include swimming, water aerobics, and biking. General instructions Take over-the-counter and prescription medicines only as told by your health care provider. Wear a night splint while sleeping, if told by your health care provider. Loosen the splint if your toes tingle, become numb, or turn cold and blue. Maintain a healthy weight, or work with your health care provider to lose weight as needed. Keep all follow-up visits. This is important. Contact a health care provider if you have: Symptoms that do not go away with home treatment. Pain that gets worse. Pain that affects your ability to move or do daily activities. Summary Plantar fasciitis is a painful foot condition that affects the heel. It occurs when the band of tissue that connects the toes to the heel bone (plantar fascia) becomes irritated. Heel pain is the main symptom of this condition. It may get worse after exercising too much or standing still for a long time. Treatment varies, but it usually starts with rest, ice, pressure (compression), and raising (elevating) the affected foot. This is called RICE therapy. Over-the-counter medicines can also be used to manage pain. This information is not intended to replace advice given to you by your health care provider. Make sure you discuss any questions you have with your health care provider. Document Revised: 01/29/2020 Document Reviewed: 01/29/2020 Elsevier Patient Education  [place].

## 2022-07-21 NOTE — Assessment & Plan Note (Signed)
Very well controlled on once daily Odefsey with food. No concerns with access or adherence to medication. They are tolerating the medication well without side effects. No drug interactions identified. Labs reviewed from March - no need to repeat today. Will have her return in December for routine care and update pertinent blood studies.  No changes to insurance coverage.  Dental addressed today.  No concern over anxious/depressed mood.  Sexual health and family planning discussed - S/P BTL.  Vaccines updated today - see health maintenance section.    Return in about 3 months (around 10/20/2022).

## 2022-07-21 NOTE — Progress Notes (Signed)
Name: Marcia Long  DOB: 1981-05-12 MRN: [id number] PCP: Patient, No Pcp Per     Brief Narrative:  Marcia Long is a 41 y.o. female with HIV disease, dx 2006. Immigrated from [place] [phone] as refugee. Native language [place]. In care at [facility] previously. Married, husband HIV+.  History of OIs: unknown.  Previous Regimens: Odefsey 2017 >> undetectable Tivicay + Truvada during pregnancy 2019/2020 Tivicay + Descovy 03-2019 Biktarvy 2022 >> rash Odefsey 2022  Genotypes: 2017 chart records indicate no major RT, PI, INSTI mutations   Subjective:   Chief Complaint  Patient presents with   [place] [employer] for appointment with [facility] interpreter. They were unable to get in touch with scheduled interpreter. Attempted to call three separate times without success and no other interpreter available.   Unable to complete majority of rooming process due to language barrier and no suitable interpreter.       HPI:  Unfortunately the [place] telephone interpretor is available today despite efforts to book ahead of time.   She required ER visit for sore throat in May - reported 58mhistory of sore throat at that time. She says she has had off and on pain that is under the chin since this visit in May. No fevers/chills Has a few bad teeth that does cause her pain.   Doing well on Odefsey once daily without any complaints. Taking with food everyday. Does not miss her medications every. Fill history reviewed in the chart and consistent with adherence and fills.   Her right foot is causing her pain - describes it to be a burning ache in the heel. Aggravated with walking but does not always bother her. Comes up periodically but not quite sure what makes it worse.    Review of Systems  Constitutional:  Negative for chills and fever.  HENT:  Positive for sore throat. Negative for tinnitus.   Eyes:  Negative for blurred vision and  photophobia.  Respiratory:  Negative for cough and sputum production.   Cardiovascular:  Negative for chest pain and leg swelling.  Gastrointestinal:  Negative for diarrhea, nausea and vomiting.  Genitourinary:  Negative for dysuria.  Musculoskeletal:        Pain in right heel  Skin:  Negative for rash.  Neurological:  Negative for headaches.    Past Medical History:  Diagnosis Date   [place] virus [place]) [place]    [place]) [place] 11/30/2018   Dx 2006 HLA neg Quantiferon neg Hep B sAg neg      Outpatient Medications Prior to Visit  Medication Sig Dispense Refill   acetaminophen (TYLENOL) 325 MG tablet Take 2 tablets (650 mg total) by mouth every 6 (six) hours as needed. (Patient not taking: Reported on 10/10/2021)     albuterol (VENTOLIN HFA) 108 (90 Base) MCG/ACT inhaler Inhale 1-2 puffs into the lungs every 6 (six) hours as needed for wheezing or shortness of breath. 18 g 6   diclofenac Sodium (VOLTAREN) 1 % GEL Apply 4 g topically 4 (four) times daily. (Patient not taking: Reported on 04/14/2021) 150 g 0   ibuprofen (ADVIL) 600 MG tablet Take 1 tablet (600 mg total) by mouth every 6 (six) hours as needed. 30 tablet 0   tiZANidine (ZANAFLEX) 4 MG tablet Take 1 tablet (4 mg total) by mouth every 8 (eight) hours as needed. (Patient not taking: Reported on 01/23/2022) 30 tablet 0  amoxicillin (AMOXIL) 500 MG capsule Take 1 capsule (500 mg total) by mouth 2 (two) times daily. 20 capsule 0   emtricitabine-rilpivir-tenofovir AF (ODEFSEY) 200-25-25 MG TABS tablet Take 1 tablet by mouth daily with breakfast. Try to take at the same time each day with a full meal 30 tablet 2   ondansetron (ZOFRAN) 4 MG tablet Take 1 tablet (4 mg total) by mouth every 8 (eight) hours as needed for nausea or vomiting. (Patient not taking: Reported on 01/23/2022) 12 tablet 0   No facility-administered medications prior to visit.     Allergies  Allergen  Reactions   Bictegravir Rash    Social History   Tobacco Use   Smoking status: Never   Smokeless tobacco: Never  Vaping Use   Vaping Use: Never used  Substance Use Topics   Alcohol use: Not Currently   Drug use: Not Currently    Social History   Substance and Sexual Activity  Sexual Activity Yes   Birth control/protection: None     Objective:   Vitals:   07/21/22 0939  BP: 116/77  Pulse: (!) 50  Temp: 98 F (36.7 C)  TempSrc: Oral  SpO2: 100%  Weight: 193 lb (87.5 kg)   Body mass index is 31.63 kg/m.    Physical Exam Vitals reviewed.  Constitutional:      Appearance: Normal appearance. She is not ill-appearing.  HENT:     Mouth/Throat:     Mouth: Mucous membranes are moist.     Pharynx: Oropharynx is clear. Posterior oropharyngeal erythema present. No oropharyngeal exudate.     Comments: Poor dentition noted. No active infection / abscess from what I can see today. Cardiovascular:     Rate and Rhythm: Normal rate and regular rhythm.  Pulmonary:     Effort: Pulmonary effort is normal.     Breath sounds: Normal breath sounds.  Abdominal:     General: Bowel sounds are normal.  Lymphadenopathy:     Head:     Right side of head: Submental adenopathy present.     Left side of head: Submental adenopathy present.  Skin:    General: Skin is warm and dry.  Neurological:     Mental Status: She is alert and oriented to person, place, and time.      Lab Results Lab Results  Component Value Date   WBC 5.1 03/11/2022   HGB 12.5 03/11/2022   HCT 38.0 03/11/2022   MCV 88.2 03/11/2022   PLT 338 03/11/2022    Lab Results  Component Value Date   CREATININE 0.79 03/11/2022   BUN 10 03/11/2022   NA 139 03/11/2022   K 4.2 03/11/2022   CL 105 03/11/2022   CO2 26 03/11/2022    Lab Results  Component Value Date   ALT 25 03/11/2022   AST 19 03/11/2022   ALKPHOS 63 03/11/2022   BILITOT 0.6 03/11/2022    Lab Results  Component Value Date   CHOL 156  08/18/2021   HDL 57 08/18/2021   LDLCALC 89 08/18/2021   TRIG 35 08/18/2021   CHOLHDL 2.7 08/18/2021   HIV 1 RNA Quant (Copies/mL)  Date Value  01/23/2022 <20 (H)  08/18/2021 28 (H)  05/08/2021 Not Detected   CD4 T Cell Abs (/uL)  Date Value  08/18/2021 830  11/19/2020 815  07/03/2020 1,099     Assessment & Plan:   Problem List Items Addressed This Visit       High   Human immunodeficiency virus (HIV)  disease ([place]) - Primary (Chronic)    Very well controlled on once daily Odefsey with food. No concerns with access or adherence to medication. They are tolerating the medication well without side effects. No drug interactions identified. Labs reviewed from March - no need to repeat today. Will have her return in December for routine care and update pertinent blood studies.  No changes to insurance coverage.  Dental addressed today.  No concern over anxious/depressed mood.  Sexual health and family planning discussed - S/P BTL.  Vaccines updated today - see health maintenance section.    Return in about 3 months (around 10/20/2022).        Relevant Medications   emtricitabine-rilpivir-tenofovir AF (ODEFSEY) 200-25-25 MG TABS tablet   Other Relevant Orders   Flu Vaccine QUAD 59moIM (Fluarix, Fluzone & Alfiuria Quad PF) (Completed)     Unprioritized   Pain of right heel    Most suspect plantar fasciitis given waxing/waning qualities and flat feet. She has tenderness overlying right heel extending to the midfoot on the Right.   Information provided about stretching maneuvers and rolling foot on frozen water bottle for pain relief 2-3 times a day as she can tolerate. Well fitting and supportive shoes (in sandals all the time) Topical Volteren QID.       Healthcare maintenance    Flu shot today.       Chronic dental pain    She had a visit in June for dental evaluation but her son was sick and unable to make this appointment. She has on exam today chronic dental  carries, a few broken teeth. No acute abscess but has a palpable submental lymph node that is a little tender to her. [facility] urgent appointment for next Wednesday. We will make every attempt to have [place] interpret[facility] for this visit, though this dialect is so challenging we have not been guaranteed an interpretor. Request has been made through [place]       SJanene Madeira MSN, NP-C [facility] Pager: [phone]: [phone]  Total Encounter: 35 minutes spent  07/21/22  10:44 AM

## 2022-07-21 NOTE — Assessment & Plan Note (Signed)
She had a visit in June for dental evaluation but her son was sick and unable to make this appointment. She has on exam today chronic dental carries, a few broken teeth. No acute abscess but has a palpable submental lymph node that is a little tender to her. We called [facility] team to schedule her for more urgent appointment for next Wednesday. We will make every attempt to have [place] phone interpretor for this visit, though this dialect is so challenging we have not been guaranteed an interpretor. Request has been made through [place].

## 2022-07-21 NOTE — Assessment & Plan Note (Signed)
Flu shot today 

## 2022-07-23 ENCOUNTER — Ambulatory Visit: Payer: Medicaid Other | Admitting: Infectious Diseases

## 2022-08-14 ENCOUNTER — Encounter (INDEPENDENT_AMBULATORY_CARE_PROVIDER_SITE_OTHER): Payer: Self-pay | Admitting: Primary Care

## 2022-08-14 ENCOUNTER — Other Ambulatory Visit (INDEPENDENT_AMBULATORY_CARE_PROVIDER_SITE_OTHER): Payer: Self-pay | Admitting: Primary Care

## 2022-08-14 ENCOUNTER — Ambulatory Visit (INDEPENDENT_AMBULATORY_CARE_PROVIDER_SITE_OTHER): Payer: Self-pay | Admitting: Primary Care

## 2022-08-14 VITALS — BP 121/82 | HR 54 | Resp 16 | Ht 64.0 in | Wt 198.1 lb

## 2022-08-14 DIAGNOSIS — E66811 Obesity, class 1: Secondary | ICD-10-CM

## 2022-08-14 DIAGNOSIS — Z6834 Body mass index (BMI) 34.0-34.9, adult: Secondary | ICD-10-CM

## 2022-08-14 DIAGNOSIS — B20 Human immunodeficiency virus [HIV] disease: Secondary | ICD-10-CM

## 2022-08-14 DIAGNOSIS — E6609 Other obesity due to excess calories: Secondary | ICD-10-CM

## 2022-08-14 DIAGNOSIS — Z7689 Persons encountering health services in other specified circumstances: Secondary | ICD-10-CM

## 2022-08-14 DIAGNOSIS — Z789 Other specified health status: Secondary | ICD-10-CM

## 2022-08-14 DIAGNOSIS — Z603 Acculturation difficulty: Secondary | ICD-10-CM

## 2022-08-14 DIAGNOSIS — M79671 Pain in right foot: Secondary | ICD-10-CM

## 2022-08-14 MED ORDER — DICLOFENAC SODIUM 1 % EX GEL: 4.0000 g | Freq: Four times a day (QID) | CUTANEOUS | 1 refills | Status: DC

## 2022-08-14 NOTE — Telephone Encounter (Signed)
refilled today 08/14/22  Requested Prescriptions  Refused Prescriptions Disp Refills  . diclofenac Sodium (VOLTAREN) 1 % GEL 350 g 1    Sig: Apply 4 g topically 4 (four) times daily.     Analgesics:  Topicals Failed - 08/14/2022  3:28 PM      Failed - Manual Review: Labs are only required if the patient has taken medication for more than 8 weeks.      Passed - PLT in normal range and within 360 days    Platelets  Date Value Ref Range Status  03/11/2022 338 150 - 400 K/uL Final  12/15/2018 177 150 - 450 x10E3/uL Final  10/17/2018 239  Final         Passed - HGB in normal range and within 360 days    Hemoglobin  Date Value Ref Range Status  03/11/2022 12.5 12.0 - 15.0 g/dL Final  12/15/2018 11.8 11.1 - 15.9 g/dL Final  10/17/2018 12.1  Final         Passed - HCT in normal range and within 360 days    HCT  Date Value Ref Range Status  03/11/2022 38.0 36.0 - 46.0 % Final  10/17/2018 38 29 - 41 Final   Hematocrit  Date Value Ref Range Status  12/15/2018 34.4 34.0 - 46.6 % Final         Passed - Cr in normal range and within 360 days    Creat  Date Value Ref Range Status  08/18/2021 0.83 0.50 - 0.99 mg/dL Final   Creatinine, Ser  Date Value Ref Range Status  03/11/2022 0.79 0.44 - 1.00 mg/dL Final         Passed - eGFR is 30 or above and within 360 days    GFR, Est African American  Date Value Ref Range Status  11/19/2020 126 > OR = 60 mL/min/1.63m Final   GFR, Est Non African American  Date Value Ref Range Status  11/19/2020 108 > OR = 60 mL/min/1.71mFinal   GFR, Estimated  Date Value Ref Range Status  03/11/2022 >60 >60 mL/min Final    Comment:    (NOTE) Calculated using the CKD-EPI Creatinine Equation (2021)          Passed - Patient is not pregnant      Passed - Valid encounter within last 12 months    Recent Outpatient Visits          Today Encounter to establish care   [place]      Future  Appointments            In 1 month Dixon, StMelton KrebsNP [facility], [facility]

## 2022-08-14 NOTE — Telephone Encounter (Signed)
Medication Refill - Medication: diclofenac Sodium (VOLTAREN) 1 % GEL  Pt medication was sent to the wrong pharmacy. Please transfer to the pharmacy below.   Has the patient contacted their pharmacy? Yes.    (Agent: If yes, when and what did the pharmacy advise?)  Preferred Pharmacy (with phone number or street name):  [id number] - [place], [place] [address]  Phone: [phone] Fax: [phone]  Hours: Not open 24 hours   Has the patient been seen for an appointment in the last year OR does the patient have an upcoming appointment? Yes.    Agent: Please be advised that RX refills may take up to 3 business days. We ask that you follow-up with your pharmacy.

## 2022-08-14 NOTE — Progress Notes (Unsigned)
New Patient Office Visit  Subjective    Patient ID: Marcia Long, female    DOB: [birth year]  Age: 41 y.o. MRN: [id number]  CC:  Chief Complaint  Patient presents with   New Patient (Initial Visit)    HPI Marcia Long is a 41 year old obese female who speaks single presents to establish care.  Patient has given her daughter Corky Sox permission to interpret for her and be present at this appointment.  She voices no complaints concerns or problems HIV positive followed by infectious disease. Patient has No headache, No chest pain, No abdominal pain - No Nausea, No new weakness tingling or numbness, No Cough - shortness of breath   Outpatient Encounter Medications as of 08/14/2022  Medication Sig   albuterol (VENTOLIN HFA) 108 (90 Base) MCG/ACT inhaler Inhale 1-2 puffs into the lungs every 6 (six) hours as needed for wheezing or shortness of breath.   emtricitabine-rilpivir-tenofovir AF (ODEFSEY) 200-25-25 MG TABS tablet Take 1 tablet by mouth daily with breakfast. Try to take at the same time each day with a full meal   acetaminophen (TYLENOL) 325 MG tablet Take 2 tablets (650 mg total) by mouth every 6 (six) hours as needed. (Patient not taking: Reported on 10/10/2021)   diclofenac Sodium (VOLTAREN) 1 % GEL Apply 4 g topically 4 (four) times daily. (Patient not taking: Reported on 04/14/2021)   ibuprofen (ADVIL) 600 MG tablet Take 1 tablet (600 mg total) by mouth every 6 (six) hours as needed. (Patient not taking: Reported on 08/14/2022)   tiZANidine (ZANAFLEX) 4 MG tablet Take 1 tablet (4 mg total) by mouth every 8 (eight) hours as needed. (Patient not taking: Reported on 01/23/2022)   No facility-administered encounter medications on file as of 08/14/2022.    Past Medical History:  Diagnosis Date   HIV (human immunodeficiency virus infection) ([place])    HIV (human immunodeficiency virus infection) ([place]) 11/30/2018   Dx 2006 HLA neg Quantiferon neg Hep B sAg neg      Past Surgical  History:  Procedure Laterality Date   CESAREAN SECTION N/A 02/24/2019   Procedure: CESAREAN SECTION;  Surgeon: Aletha Halim, MD;  Location: [facility] LD ORS;  Service: Obstetrics;  Laterality: N/A;   NO PAST SURGERIES      No family history on file.  Social History   Socioeconomic History   Marital status: Married    Spouse name: Not on file   Number of children: Not on file   Years of education: Not on file   Highest education level: Never attended school  Occupational History   Not on file  Tobacco Use   Smoking status: Never   Smokeless tobacco: Never  Vaping Use   Vaping Use: Never used  Substance and Sexual Activity   Alcohol use: Not Currently   Drug use: Not Currently   Sexual activity: Yes    Birth control/protection: None  Other Topics Concern   Not on file  Social History Narrative   Not on file   Social Determinants of Health   Financial Resource Strain: Not on file  Food Insecurity: Food Insecurity Present (11/03/2018)   Hunger Vital Sign    Worried About Running Out of Food in the Last Year: Often true    Ran Out of Food in the Last Year: Often true  Transportation Needs: Unmet Transportation Needs (11/03/2018)   PRAPARE - Hydrologist (Medical): Yes    Lack of Transportation (Non-Medical): Yes  Physical Activity: Not on  file  Stress: Not on file  Social Connections: Not on file  Intimate Partner Violence: Not on file    ROS Comprehensive ROS Pertinent positive and negative noted in HPI       Objective    BP 121/82   Pulse (!) 54   Resp 16   Ht _0  (1.626 m)   Wt 198 lb 1 oz (89.8 kg)   SpO2 100%   BMI 34.00 kg/m  Physical exam: General: Vital signs reviewed.  Patient is well-developed and well-nourished, obese female in no acute distress and cooperative with exam. Head: Normocephalic and atraumatic. Eyes: EOMI, conjunctivae normal, no scleral icterus. Neck: Supple, trachea midline, normal ROM, no JVD, masses,  thyromegaly, or carotid bruit present. Cardiovascular: RRR, S1 normal, S2 normal, no murmurs, gallops, or rubs. Pulmonary/Chest: Clear to auscultation bilaterally, no wheezes, rales, or rhonchi. Abdominal: Soft, non-tender, non-distended, BS +, no masses, organomegaly, or guarding present. Musculoskeletal: No joint deformities, erythema, or stiffness, ROM full and nontender. Extremities: No lower extremity edema bilaterally,  pulses symmetric and intact bilaterally. No cyanosis or clubbing. Neurological: A&O x3, Strength is normal Skin: Warm, dry and intact. No rashes or erythema. Psychiatric: Normal Long and affect. speech and behavior is normal. Cognition and memory are normal.         Assessment & Plan:   Vonzella was seen today for new patient (initial visit).  Diagnoses and all orders for this visit:  Encounter to establish care  Language barrier  Human immunodeficiency virus (HIV) disease ([place])    No follow-ups on file.   Marcia Perna, NP

## 2022-09-14 ENCOUNTER — Other Ambulatory Visit (HOSPITAL_COMMUNITY): Payer: Self-pay

## 2022-10-12 ENCOUNTER — Telehealth: Payer: Self-pay

## 2022-10-12 ENCOUNTER — Other Ambulatory Visit: Payer: Self-pay

## 2022-10-12 ENCOUNTER — Encounter: Payer: Self-pay | Admitting: Infectious Diseases

## 2022-10-12 ENCOUNTER — Ambulatory Visit (INDEPENDENT_AMBULATORY_CARE_PROVIDER_SITE_OTHER): Payer: Self-pay | Admitting: Infectious Diseases

## 2022-10-12 ENCOUNTER — Other Ambulatory Visit (HOSPITAL_COMMUNITY): Payer: Self-pay

## 2022-10-12 VITALS — BP 131/83 | HR 60 | Temp 98.0°F | Wt 202.0 lb

## 2022-10-12 DIAGNOSIS — B20 Human immunodeficiency virus [HIV] disease: Secondary | ICD-10-CM

## 2022-10-12 DIAGNOSIS — Z789 Other specified health status: Secondary | ICD-10-CM

## 2022-10-12 MED ORDER — EMTRICITAB-RILPIVIR-TENOFOV AF 200-25-25 MG PO TABS
1.0000 | ORAL_TABLET | Freq: Every day | ORAL | 11 refills | Status: DC
  Filled 2022-10-12 (×2): qty 30, 30d supply, fill #0
  Filled 2022-11-16: qty 30, 30d supply, fill #1
  Filled 2022-12-08: qty 30, 30d supply, fill #2
  Filled 2022-12-31: qty 30, 30d supply, fill #3
  Filled 2023-01-28: qty 30, 30d supply, fill #4
  Filled 2023-02-24: qty 30, 30d supply, fill #5
  Filled 2023-03-18: qty 30, 30d supply, fill #6
  Filled 2023-04-20: qty 30, 30d supply, fill #7
  Filled 2023-05-17: qty 30, 30d supply, fill #8

## 2022-10-12 NOTE — Telephone Encounter (Signed)
[facility] Patient Advocate Encounter  Completed and sent [employer] Advancing Access application for [facility] for this patient who is uninsured.    Patient is approved 10/12/22 through 10/12/23.     Clearance Coots, CPhT Specialty Pharmacy Patient [facility] for Infectious Disease Phone: [phone] Fax:  [phone]

## 2022-10-12 NOTE — Assessment & Plan Note (Signed)
[place] interpretor was available today. Will again work to coordinate reservation at return.

## 2022-10-12 NOTE — Patient Instructions (Addendum)
The money order did not work because there was no insurance active to pay for the rest of the [place].   We need to get your Medicaid turned back on - hopeful that our financial team can help do that today.   For now we will get you patient assistance with the drug company - we will have this mailed from [employer] to your home until we can get alternative arranged.

## 2022-10-12 NOTE — Progress Notes (Signed)
Name: Marcia Long  DOB: [birth year] MRN: [id number] PCP: [name], NP     Brief Narrative:  Marcia Long is a 41 y.o. female with HIV disease, dx 2006. Immigrated from [id number] as refugee. Native language S[place]. In care at [facility] previously. Married, husband HIV+.  History of OIs: unknown.  Previous Regimens: Odefsey 2017 >> undetectable Tivicay + Truvada during pregnancy 2019/2020 Tivicay + Descovy 03-2019 Biktarvy 2022 >> rash Odefsey 2022  Genotypes: 2017 chart records indicate no major RT, PI, INSTI mutations   Subjective:   Chief Complaint  Patient presents with   Follow-up      HPI:  [place] interpretor present during the visit today via telephone. We were interrupted a few times during the visit for a dropped call.   She has had a change in her medicaid to family planning for some reason. From what I can tell in the system she has not had Joiner shipped to her since the end of September, making her lapse for about 2 months from what I can tell.  She does not think anything has changed on her end but one day she was not able to get the medicine. She has not had any contact with anyone about this from what she can tell.   She has no concerns today outside of not having access to her medicine.  She says she tried to pay with a money order but it was returned citing it needed banking information or cash.    Review of Systems  Constitutional:  Negative for chills and fever.  HENT:  Positive for sore throat. Negative for tinnitus.   Eyes:  Negative for blurred vision and photophobia.  Respiratory:  Negative for cough and sputum production.   Cardiovascular:  Negative for chest pain and leg swelling.  Gastrointestinal:  Negative for diarrhea, nausea and vomiting.  Genitourinary:  Negative for dysuria.  Musculoskeletal:        Pain in right heel  Skin:  Negative for rash.  Neurological:  Negative for  headaches.    Past Medical History:  Diagnosis Date   HIV (human immunodeficiency virus infection) ([place])    HIV (human immunodeficiency virus infection) ([place]) 11/30/2018   Dx 2006 HLA neg Quantiferon neg Hep B sAg neg      Outpatient Medications Prior to Visit  Medication Sig Dispense Refill   emtricitabine-rilpivir-tenofovir AF (ODEFSEY) 200-25-25 MG TABS tablet Take 1 tablet by mouth daily with breakfast. Try to take at the same time each day with a full meal 30 tablet 11   acetaminophen (TYLENOL) 325 MG tablet Take 2 tablets (650 mg total) by mouth every 6 (six) hours as needed. (Patient not taking: Reported on 10/10/2021)     albuterol (VENTOLIN HFA) 108 (90 Base) MCG/ACT inhaler Inhale 1-2 puffs into the lungs every 6 (six) hours as needed for wheezing or shortness of breath. 18 g 6   diclofenac Sodium (VOLTAREN) 1 % GEL Apply 4 g topically 4 (four) times daily. 350 g 1   ibuprofen (ADVIL) 600 MG tablet Take 1 tablet (600 mg total) by mouth every 6 (six) hours as needed. (Patient not taking: Reported on 08/14/2022) 30 tablet 0   tiZANidine (ZANAFLEX) 4 MG tablet Take 1 tablet (4 mg total) by mouth every 8 (eight) hours as needed. (Patient not taking: Reported on 01/23/2022) 30 tablet 0   No facility-administered medications prior to visit.     Allergies  Allergen Reactions  Bictegravir Rash    Social History   Tobacco Use   Smoking status: Never   Smokeless tobacco: Never  Vaping Use   Vaping Use: Never used  Substance Use Topics   Alcohol use: Not Currently   Drug use: Not Currently    Social History   Substance and Sexual Activity  Sexual Activity Yes   Birth control/protection: None     Objective:   Vitals:   10/12/22 0945  BP: 131/83  Pulse: 60  Temp: 98 F (36.7 C)  SpO2: 98%  Weight: 202 lb (91.6 kg)   Body mass index is 34.67 kg/m.    Physical Exam Vitals reviewed.  Constitutional:      Appearance: Normal appearance. She is not  ill-appearing.  HENT:     Mouth/Throat:     Mouth: Mucous membranes are moist.     Pharynx: Oropharynx is clear. Posterior oropharyngeal erythema present. No oropharyngeal exudate.     Comments: Poor dentition noted. No active infection / abscess from what I can see today. Cardiovascular:     Rate and Rhythm: Normal rate and regular rhythm.  Pulmonary:     Effort: Pulmonary effort is normal.     Breath sounds: Normal breath sounds.  Abdominal:     General: Bowel sounds are normal.  Lymphadenopathy:     Head:     Right side of head: Submental adenopathy present.     Left side of head: Submental adenopathy present.  Skin:    General: Skin is warm and dry.  Neurological:     Mental Status: She is alert and oriented to person, place, and time.      Lab Results Lab Results  Component Value Date   WBC 5.1 03/11/2022   HGB 12.5 03/11/2022   HCT 38.0 03/11/2022   MCV 88.2 03/11/2022   PLT 338 03/11/2022    Lab Results  Component Value Date   CREATININE 0.79 03/11/2022   BUN 10 03/11/2022   NA 139 03/11/2022   K 4.2 03/11/2022   CL 105 03/11/2022   CO2 26 03/11/2022    Lab Results  Component Value Date   ALT 25 03/11/2022   AST 19 03/11/2022   ALKPHOS 63 03/11/2022   BILITOT 0.6 03/11/2022    Lab Results  Component Value Date   CHOL 156 08/18/2021   HDL 57 08/18/2021   LDLCALC 89 08/18/2021   TRIG 35 08/18/2021   CHOLHDL 2.7 08/18/2021   HIV 1 RNA Quant (Copies/mL)  Date Value  01/23/2022 <20 (H)  08/18/2021 28 (H)  05/08/2021 Not Detected   CD4 T Cell Abs (/uL)  Date Value  08/18/2021 830  11/19/2020 815  07/03/2020 1,099     Assessment & Plan:   Problem List Items Addressed This Visit       High   Human immunodeficiency virus (HIV) disease ([place]) (Chronic)    Very well controlled on once daily Odefsey normally however has had nearly 3 months of not being on her medicine. It appears that her medicaid has expired as of 9/30 per [place] [employer]. Prior to  this she was doing really well on it.  No concern over anxious/depressed mood.  Sexual health and family planning discussed - no needs today.   We spent most of the visit arranging for patient assistance and Rx to be mailed from [facility] for now. She also met with financial team to help with further needs.   Will have her back in 3 months to receive  prevnar 20 and update her blood work to ensure still undetectable.   No follow-ups on file.        Relevant Medications   emtricitabine-rilpivir-tenofovir AF (ODEFSEY) 200-25-25 MG TABS tablet     Unprioritized   Language barrier - Primary    Fortunately interpretor was available today. Will again work to coordinate reservation at return.        Janene Madeira, MSN, NP-C [facility] for Infectious [place] Pager: [phone] Office: [phone]   Total Encounter: 30 minutes   10/12/22  10:22 AM

## 2022-10-12 NOTE — Assessment & Plan Note (Addendum)
Very well controlled on once daily Odefsey normally however has had nearly 3 months of not being on her medicine. It appears that her medicaid has expired as of 9/30 per [place] [employer]. Prior to this she was doing really well on it.  No concern over anxious/depressed mood.  Sexual health and family planning discussed - no needs today.   We spent most of the visit arranging for patient assistance and Rx to be mailed from [facility] for now. She also met with financial team to help with further needs.   Will have her back in 3 months to receive prevnar 20 and update her blood work to ensure still undetectable.   No follow-ups on file.

## 2022-10-16 ENCOUNTER — Other Ambulatory Visit (HOSPITAL_COMMUNITY): Payer: Self-pay

## 2022-10-22 ENCOUNTER — Encounter: Payer: Self-pay | Admitting: Infectious Diseases

## 2022-11-02 ENCOUNTER — Other Ambulatory Visit (HOSPITAL_COMMUNITY): Payer: Self-pay

## 2022-11-04 ENCOUNTER — Other Ambulatory Visit (HOSPITAL_COMMUNITY): Payer: Self-pay

## 2022-11-06 ENCOUNTER — Other Ambulatory Visit (HOSPITAL_COMMUNITY): Payer: Self-pay

## 2022-11-16 ENCOUNTER — Other Ambulatory Visit (HOSPITAL_COMMUNITY): Payer: Self-pay

## 2022-12-02 ENCOUNTER — Other Ambulatory Visit (HOSPITAL_COMMUNITY): Payer: Self-pay

## 2022-12-03 ENCOUNTER — Other Ambulatory Visit (HOSPITAL_COMMUNITY): Payer: Self-pay

## 2022-12-07 ENCOUNTER — Other Ambulatory Visit (HOSPITAL_COMMUNITY): Payer: Self-pay

## 2022-12-08 ENCOUNTER — Other Ambulatory Visit: Payer: Self-pay

## 2022-12-08 ENCOUNTER — Other Ambulatory Visit (HOSPITAL_COMMUNITY): Payer: Self-pay

## 2022-12-09 ENCOUNTER — Other Ambulatory Visit (HOSPITAL_COMMUNITY): Payer: Self-pay

## 2022-12-09 ENCOUNTER — Other Ambulatory Visit: Payer: Self-pay

## 2022-12-11 IMAGING — CR DG CHEST 2V
3 series · 3 of 3 positions shown · non-contrast
Comparison: November 15, 2015

CLINICAL DATA: Chest pain and shortness of breath x3 weeks.

EXAM:
CHEST - 2 VIEW

[w chest pa]
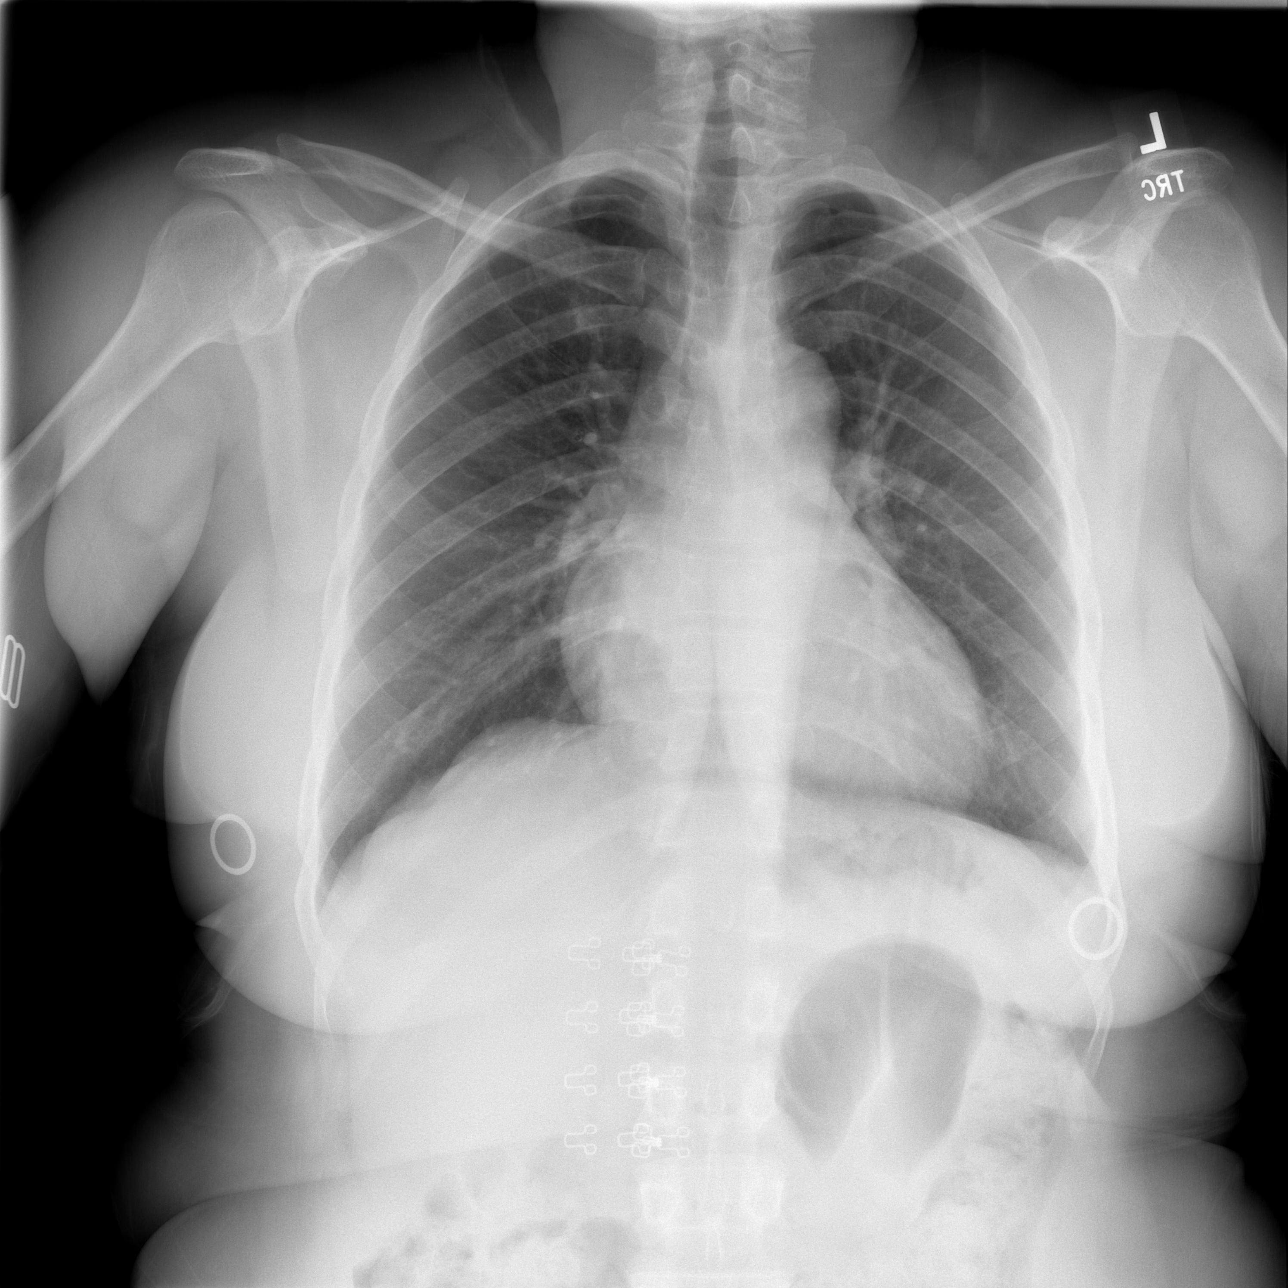

[w chest lat]
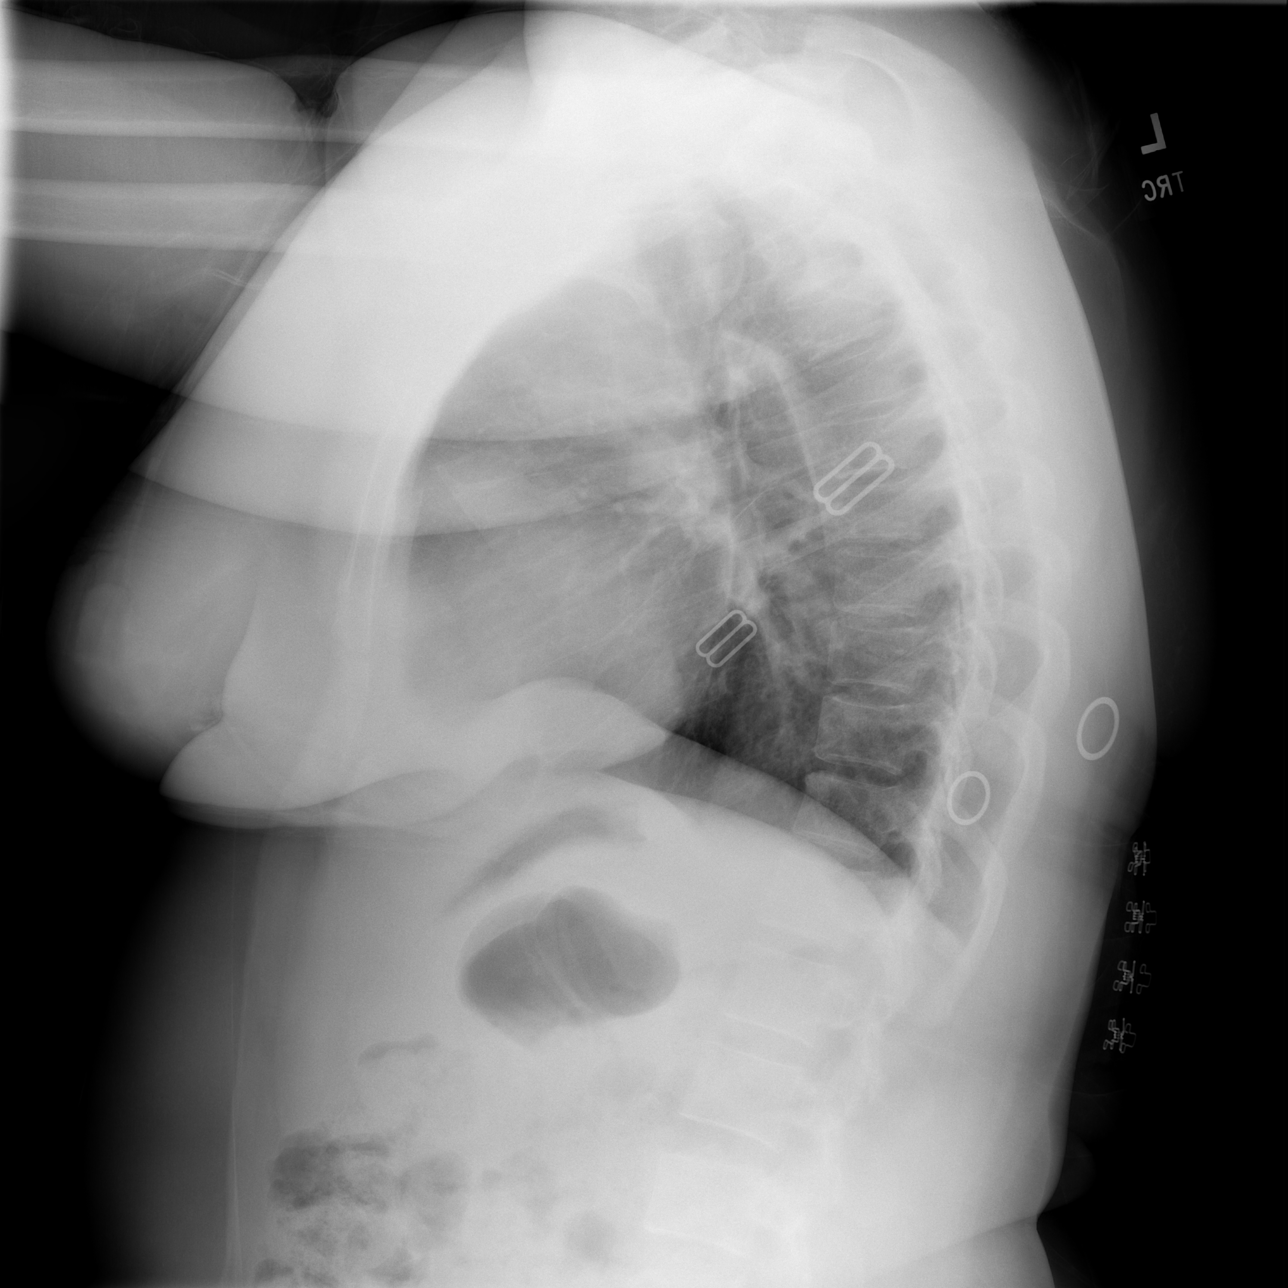

[w chest ap]
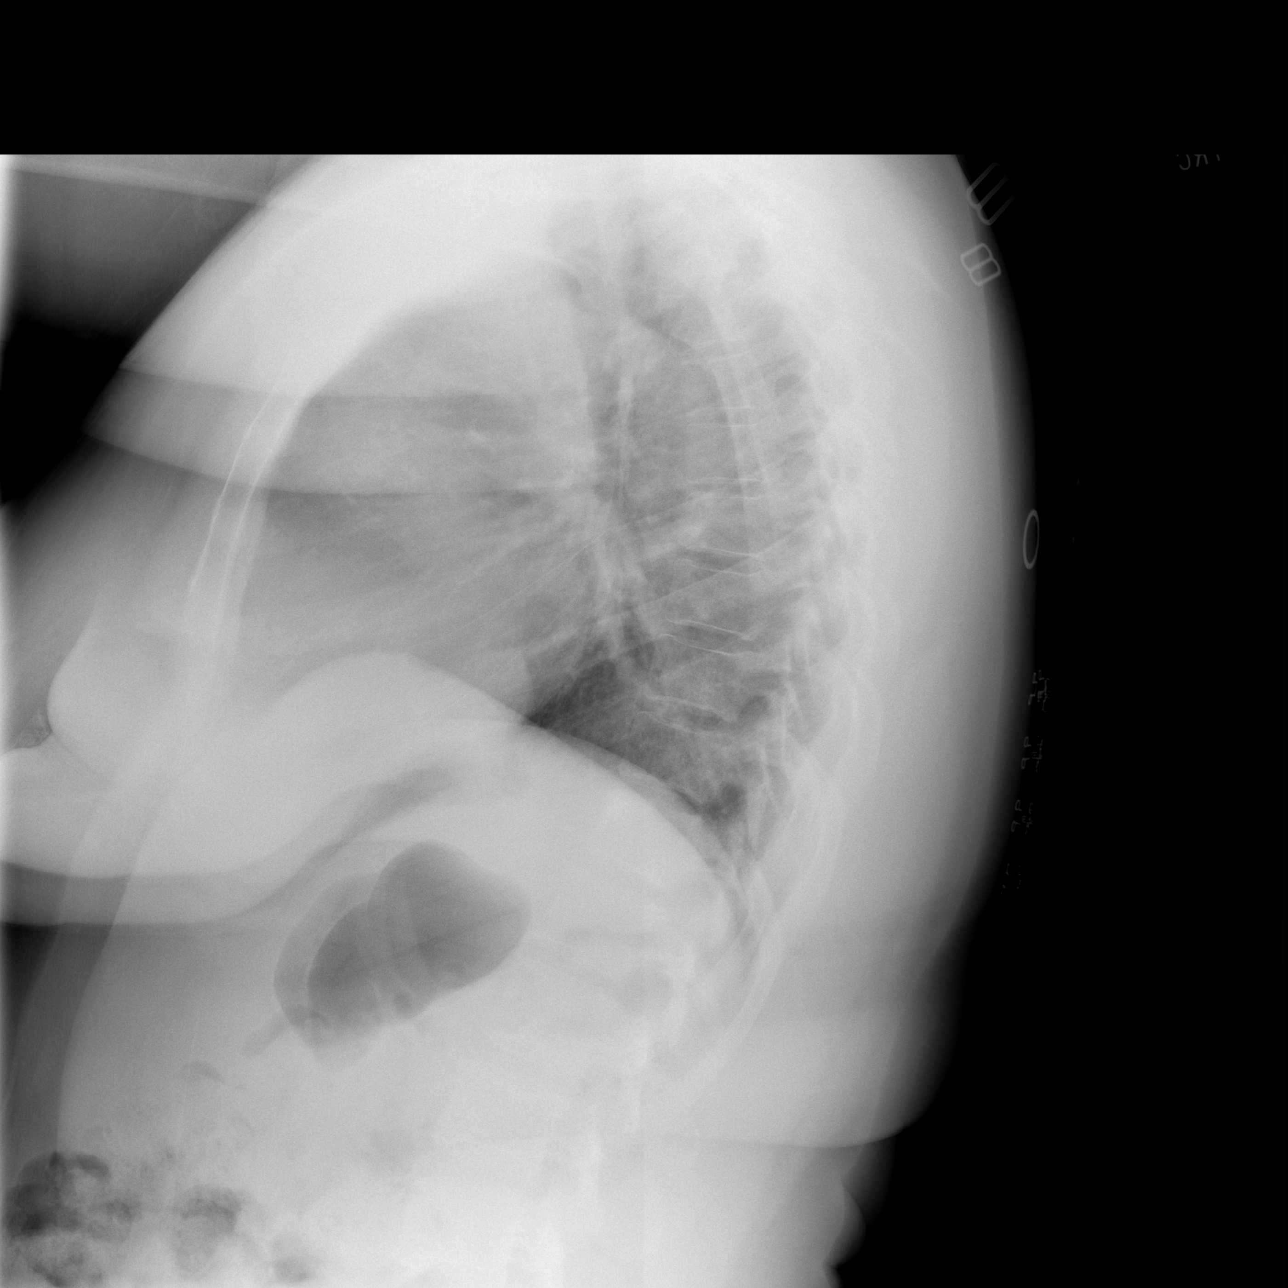

[3 of 3 positions shown; findings below may reference images not displayed]

FINDINGS: The heart size and mediastinal contours are within normal limits.
Both lungs are clear. The visualized skeletal structures are
unremarkable.
IMPRESSION: No active cardiopulmonary disease.

## 2022-12-29 ENCOUNTER — Other Ambulatory Visit (HOSPITAL_COMMUNITY): Payer: Self-pay

## 2022-12-31 ENCOUNTER — Other Ambulatory Visit (HOSPITAL_COMMUNITY): Payer: Self-pay

## 2023-01-05 ENCOUNTER — Encounter: Payer: Self-pay | Admitting: Infectious Diseases

## 2023-01-05 ENCOUNTER — Other Ambulatory Visit: Payer: Self-pay

## 2023-01-05 ENCOUNTER — Other Ambulatory Visit (HOSPITAL_COMMUNITY): Payer: Self-pay

## 2023-01-05 ENCOUNTER — Ambulatory Visit (INDEPENDENT_AMBULATORY_CARE_PROVIDER_SITE_OTHER): Payer: Self-pay

## 2023-01-05 ENCOUNTER — Ambulatory Visit (INDEPENDENT_AMBULATORY_CARE_PROVIDER_SITE_OTHER): Payer: Self-pay | Admitting: Infectious Diseases

## 2023-01-05 VITALS — BP 131/84 | HR 60 | Resp 16 | Ht 64.0 in | Wt 202.0 lb

## 2023-01-05 DIAGNOSIS — R051 Acute cough: Secondary | ICD-10-CM | POA: Insufficient documentation

## 2023-01-05 DIAGNOSIS — B20 Human immunodeficiency virus [HIV] disease: Secondary | ICD-10-CM

## 2023-01-05 DIAGNOSIS — Z23 Encounter for immunization: Secondary | ICD-10-CM

## 2023-01-05 NOTE — Assessment & Plan Note (Signed)
Well-appearing on exam and normal lung sounds.  She does not have any allergic rhinitis/conjunctivitis symptoms.  I do suspect however that she is suffering from some environmental allergies.  No concern for any bacterial infection ongoing at the moment.  We discussed treatment with over-the-counter antihistamines taken daily to see if this helps.  Other considerations could be reflux though she is not been one to have this is a problem before.  I asked her to call with any changes or concerns that would warrant reeval

## 2023-01-05 NOTE — Patient Instructions (Addendum)
  For the cough -  Try either Allegra or Xyzal - over the counter allergy medication. Take either one ONE tablet once a day. Generic version is cheaper.   Don't forget to bring the bills that you have from our office for your appointment in 2 weeks so we can try to help.

## 2023-01-05 NOTE — Progress Notes (Signed)
Name: Marcia Long  DOB: 29-Jul-1981 MRN: [id number] PCP: Marcia Perna, NP     Brief Narrative:  Marcia Long is a 42 y.o. female with HIV disease, dx 2006. Immigrated from [id number] as refugee. Native language [place]. In care at [facility] previously. Married, husband HIV+.  History of OIs: unknown.  Previous Regimens: Odefsey 2017 >> undetectable Tivicay + Truvada during pregnancy 2019/2020 Tivicay + Descovy 03-2019 Biktarvy 2022 >> rash Odefsey 2022  Genotypes: 2017 chart records indicate no major RT, PI, INSTI mutations   Subjective:   Chief Complaint  Patient presents with   Follow-up      HPI:  [place] interpretor present during the visit today via telephone.   Has been getting her Odefsey filled regularly from [facility] with another fill due this week.  She has been feeling well but feels that "the flu is bothering her a lot lately." Coughing is the only symptom she has had. This has started this past Sunday. No f/c or myalgias. No itchy / watery eyes or rhinorrhea. Slight sore throat at times. She does produce some phlegm. No one else at home has been ill including her youngest children.   Has a bill for $170 from last visit and $225 from the one prior.    Review of Systems  HENT:  Negative for congestion, ear pain, sinus pain, sore throat and tinnitus.   Respiratory:  Positive for cough. Negative for stridor.   Cardiovascular:  Negative for chest pain, orthopnea and leg swelling.  Gastrointestinal:  Negative for abdominal pain, diarrhea and vomiting.  Skin:  Negative for rash.    Past Medical History:  Diagnosis Date   HIV (human immunodeficiency virus infection) ([place])    HIV (human immunodeficiency virus infection) ([place]) 11/30/2018   Dx 2006 HLA neg Quantiferon neg Hep B sAg neg      Outpatient Medications Prior to Visit  Medication Sig Dispense Refill   albuterol (VENTOLIN HFA) 108 (90 Base) MCG/ACT inhaler  Inhale 1-2 puffs into the lungs every 6 (six) hours as needed for wheezing or shortness of breath. 18 g 6   diclofenac Sodium (VOLTAREN) 1 % GEL Apply 4 g topically 4 (four) times daily. 350 g 1   emtricitabine-rilpivir-tenofovir AF (ODEFSEY) 200-25-25 MG TABS tablet Take 1 tablet by mouth daily with breakfast. Try to take at the same time each day with a full meal 30 tablet 11   acetaminophen (TYLENOL) 325 MG tablet Take 2 tablets (650 mg total) by mouth every 6 (six) hours as needed. (Patient not taking: Reported on 10/10/2021)     ibuprofen (ADVIL) 600 MG tablet Take 1 tablet (600 mg total) by mouth every 6 (six) hours as needed. (Patient not taking: Reported on 08/14/2022) 30 tablet 0   tiZANidine (ZANAFLEX) 4 MG tablet Take 1 tablet (4 mg total) by mouth every 8 (eight) hours as needed. (Patient not taking: Reported on 01/23/2022) 30 tablet 0   No facility-administered medications prior to visit.     Allergies  Allergen Reactions   Bictegravir Rash    Social History   Tobacco Use   Smoking status: Never    Passive exposure: Never   Smokeless tobacco: Never  Vaping Use   Vaping Use: Never used  Substance Use Topics   Alcohol use: Not Currently   Drug use: Not Currently    Social History   Substance and Sexual Activity  Sexual Activity Yes   Birth control/protection: None  Objective:   Vitals:   01/05/23 0937  BP: 131/84  Pulse: 60  Resp: 16  Weight: 202 lb (91.6 kg)  Height: '5\' 4"'$  (1.626 m)   Body mass index is 34.67 kg/m.    Physical Exam Constitutional:      Appearance: She is well-developed.     Comments: Seated comfortably in chair.   HENT:     Right Ear: Tympanic membrane normal.     Left Ear: Tympanic membrane normal.     Nose: Nose normal. No congestion or rhinorrhea.     Mouth/Throat:     Mouth: No oral lesions.     Dentition: Normal dentition. No dental abscesses.     Pharynx: No oropharyngeal exudate.  Eyes:     General:        Right  eye: No discharge.        Left eye: No discharge.     Conjunctiva/sclera: Conjunctivae normal.  Cardiovascular:     Rate and Rhythm: Normal rate and regular rhythm.     Heart sounds: Normal heart sounds.  Pulmonary:     Effort: Pulmonary effort is normal.     Breath sounds: Normal breath sounds.  Abdominal:     General: There is no distension.     Palpations: Abdomen is soft.     Tenderness: There is no abdominal tenderness.  Lymphadenopathy:     Cervical: No cervical adenopathy.  Skin:    General: Skin is warm and dry.     Findings: No rash.  Neurological:     Mental Status: She is alert and oriented to person, place, and time.  Psychiatric:        Judgment: Judgment normal.     Comments: In good spirits today and engaged in care discussion      Lab Results Lab Results  Component Value Date   WBC 5.1 03/11/2022   HGB 12.5 03/11/2022   HCT 38.0 03/11/2022   MCV 88.2 03/11/2022   PLT 338 03/11/2022    Lab Results  Component Value Date   CREATININE 0.79 03/11/2022   BUN 10 03/11/2022   NA 139 03/11/2022   K 4.2 03/11/2022   CL 105 03/11/2022   CO2 26 03/11/2022    Lab Results  Component Value Date   ALT 25 03/11/2022   AST 19 03/11/2022   ALKPHOS 63 03/11/2022   BILITOT 0.6 03/11/2022    Lab Results  Component Value Date   CHOL 156 08/18/2021   HDL 57 08/18/2021   LDLCALC 89 08/18/2021   TRIG 35 08/18/2021   CHOLHDL 2.7 08/18/2021   HIV 1 RNA Quant (Copies/mL)  Date Value  01/23/2022 <20 (H)  08/18/2021 28 (H)  05/08/2021 Not Detected   CD4 T Cell Abs (/uL)  Date Value  08/18/2021 830  11/19/2020 815  07/03/2020 1,099     Assessment & Plan:   Problem List Items Addressed This Visit       High   Human immunodeficiency virus (HIV) disease ([place]) - Primary (Chronic)    Well-controlled on once daily Odefsey taken with food.  She has now had her medications being automatically filled for her through [facility].  She was  very appreciative of the help.  I am not quite sure what bills she has that she does not have the paperwork today.  We planned for a follow-up visit with our financial team in 2 weeks so we can have the interpreter booked for assistance.  She will  arrive early to her appointment and bring all of the bills question.   COVID-vaccine booster provided today otherwise up-to-date.  Pap smear up-to-date.  Will need to work on breast cancer screening next - no family history to her knowledge of breast cancer.    FU appt made with me in 4 months .       Relevant Orders   CBC   COMPLETE METABOLIC PANEL WITH GFR   RPR   HIV 1 RNA quant-no reflex-bld   T-helper cells (CD4) count   Lipid panel   Hemoglobin A1c   TSH + free T4     Unprioritized   Acute cough    Well-appearing on exam and normal lung sounds.  She does not have any allergic rhinitis/conjunctivitis symptoms.  I do suspect however that she is suffering from some environmental allergies.  No concern for any bacterial infection ongoing at the moment.  We discussed treatment with over-the-counter antihistamines taken daily to see if this helps.  Other considerations could be reflux though she is not been one to have this is a problem before.  I asked her to call with any changes or concerns that would warrant reeval      Janene Madeira, MSN, NP-C [place] for Infectious [place] Pager: [phone] Office: [phone]   Total Encounter: 30 minutes   01/05/23  10:42 AM

## 2023-01-05 NOTE — Assessment & Plan Note (Signed)
Well-controlled on once daily Odefsey taken with food.  She has now had her medications being automatically filled for her through [facility].  She was very appreciative of the help.  I am not quite sure what bills she has that she does not have the paperwork today.  We planned for a follow-up visit with our financial team in 2 weeks so we can have the interpreter booked for assistance.  She will arrive early to her appointment and bring all of the bills question.   COVID-vaccine booster provided today otherwise up-to-date.  Pap smear up-to-date.  Will need to work on breast cancer screening next - no family history to her knowledge of breast cancer.    FU appt made with me in 4 months .

## 2023-01-06 ENCOUNTER — Other Ambulatory Visit (HOSPITAL_COMMUNITY): Payer: Self-pay

## 2023-01-06 LAB — T-HELPER CELLS (CD4) COUNT (NOT AT ARMC)
CD4 % Helper T Cell: 48 % (ref 33–65)
CD4 T Cell Abs: 675 /uL (ref 400–1790)

## 2023-01-08 LAB — COMPLETE METABOLIC PANEL WITH GFR
AG Ratio: 1.5 (calc) (ref 1.0–2.5)
ALT: 15 U/L (ref 6–29)
AST: 12 U/L (ref 10–30)
Albumin: 4.1 g/dL (ref 3.6–5.1)
Alkaline phosphatase (APISO): 49 U/L (ref 31–125)
BUN: 13 mg/dL (ref 7–25)
CO2: 25 mmol/L (ref 20–32)
Calcium: 8.8 mg/dL (ref 8.6–10.2)
Chloride: 107 mmol/L (ref 98–110)
Creat: 0.78 mg/dL (ref 0.50–0.99)
Globulin: 2.7 g/dL (calc) (ref 1.9–3.7)
Glucose, Bld: 137 mg/dL — ABNORMAL HIGH (ref 65–99)
Potassium: 3.8 mmol/L (ref 3.5–5.3)
Sodium: 140 mmol/L (ref 135–146)
Total Bilirubin: 0.3 mg/dL (ref 0.2–1.2)
Total Protein: 6.8 g/dL (ref 6.1–8.1)
eGFR: 97 mL/min/{1.73_m2} (ref >=60)

## 2023-01-08 LAB — CBC
HCT: 39 % (ref 35.0–45.0)
Hemoglobin: 12.8 g/dL (ref 11.7–15.5)
MCH: 28.8 pg (ref 27.0–33.0)
MCHC: 32.8 g/dL (ref 32.0–36.0)
MCV: 87.8 fL (ref 80.0–100.0)
MPV: 10.2 fL (ref 7.5–12.5)
Platelets: 249 10*3/uL (ref 140–400)
RBC: 4.44 10*6/uL (ref 3.80–5.10)
RDW: 13.1 % (ref 11.0–15.0)
WBC: 3.3 10*3/uL — ABNORMAL LOW (ref 3.8–10.8)

## 2023-01-08 LAB — LIPID PANEL
Cholesterol: 156 mg/dL (ref <200)
HDL: 62 mg/dL (ref >=50)
LDL Cholesterol (Calc): 80 mg/dL (calc)
Non-HDL Cholesterol (Calc): 94 mg/dL (calc) (ref <130)
Total CHOL/HDL Ratio: 2.5 (calc) (ref <5.0)
Triglycerides: 64 mg/dL (ref <150)

## 2023-01-08 LAB — HIV-1 RNA QUANT-NO REFLEX-BLD
HIV 1 RNA Quant: NOT DETECTED Copies/mL
HIV-1 RNA Quant, Log: NOT DETECTED Log cps/mL

## 2023-01-08 LAB — HEMOGLOBIN A1C
Hgb A1c MFr Bld: 6.8 % of total Hgb — ABNORMAL HIGH (ref <5.7)
Mean Plasma Glucose: 148 mg/dL
eAG (mmol/L): 8.2 mmol/L

## 2023-01-08 LAB — RPR: RPR Ser Ql: NONREACTIVE

## 2023-01-08 LAB — TSH+FREE T4: TSH W/REFLEX TO FT4: 2.37 mIU/L

## 2023-01-18 ENCOUNTER — Ambulatory Visit: Payer: Self-pay

## 2023-01-18 ENCOUNTER — Other Ambulatory Visit: Payer: Self-pay

## 2023-01-28 ENCOUNTER — Other Ambulatory Visit (HOSPITAL_COMMUNITY): Payer: Self-pay

## 2023-01-29 ENCOUNTER — Other Ambulatory Visit (HOSPITAL_COMMUNITY): Payer: Self-pay

## 2023-02-24 ENCOUNTER — Other Ambulatory Visit (HOSPITAL_COMMUNITY): Payer: Self-pay

## 2023-02-25 ENCOUNTER — Other Ambulatory Visit (HOSPITAL_COMMUNITY): Payer: Self-pay

## 2023-03-18 ENCOUNTER — Other Ambulatory Visit (HOSPITAL_COMMUNITY): Payer: Self-pay

## 2023-03-23 ENCOUNTER — Other Ambulatory Visit (HOSPITAL_COMMUNITY): Payer: Self-pay

## 2023-04-07 NOTE — Progress Notes (Signed)
ASCVD Risk Score: 0.5%  Sex: Female Race: African American  Age: 42 Total Cholesterol (mg/dL) 161 HDL Cholesterol (mg/dL) 62 LDL Cholesterol (mg/dL) 80 Systolic Blood Pressure (mm Hg) 131 Diastolic Blood Pressure (mm Hg) 84 Diabetes: No Smoker: Never Treatment for Hypertension: No Aspirin Therapy: No Statin: No  Sandie Ano, RN

## 2023-04-20 ENCOUNTER — Other Ambulatory Visit (HOSPITAL_COMMUNITY): Payer: Self-pay

## 2023-04-23 ENCOUNTER — Other Ambulatory Visit (HOSPITAL_COMMUNITY): Payer: Self-pay

## 2023-05-17 ENCOUNTER — Other Ambulatory Visit (HOSPITAL_COMMUNITY): Payer: Self-pay

## 2023-05-19 ENCOUNTER — Other Ambulatory Visit (HOSPITAL_COMMUNITY): Payer: Self-pay

## 2023-05-20 ENCOUNTER — Ambulatory Visit: Payer: Self-pay | Admitting: Infectious Diseases

## 2023-05-28 ENCOUNTER — Ambulatory Visit: Payer: Medicaid Other | Admitting: Infectious Diseases

## 2023-06-09 ENCOUNTER — Ambulatory Visit: Payer: Self-pay | Admitting: Infectious Diseases

## 2023-06-16 ENCOUNTER — Other Ambulatory Visit: Payer: Self-pay

## 2023-06-17 ENCOUNTER — Ambulatory Visit: Payer: Self-pay

## 2023-06-17 ENCOUNTER — Ambulatory Visit: Payer: Self-pay | Admitting: Infectious Diseases

## 2023-06-17 ENCOUNTER — Other Ambulatory Visit: Payer: Self-pay

## 2023-06-17 ENCOUNTER — Other Ambulatory Visit: Payer: Self-pay | Admitting: Infectious Diseases

## 2023-06-17 DIAGNOSIS — B20 Human immunodeficiency virus [HIV] disease: Secondary | ICD-10-CM

## 2023-06-18 LAB — T-HELPER CELLS (CD4) COUNT (NOT AT ARMC)
CD4 % Helper T Cell: 47 % (ref 33–65)
CD4 T Cell Abs: 919 /uL (ref 400–1790)

## 2023-06-19 LAB — CBC
HCT: 36.7 % (ref 35.0–45.0)
Hemoglobin: 12.1 g/dL (ref 11.7–15.5)
MCH: 28.7 pg (ref 27.0–33.0)
MCHC: 33 g/dL (ref 32.0–36.0)
MCV: 87 fL (ref 80.0–100.0)
MPV: 10.4 fL (ref 7.5–12.5)
Platelets: 261 10*3/uL (ref 140–400)
RBC: 4.22 10*6/uL (ref 3.80–5.10)
RDW: 12.6 % (ref 11.0–15.0)
WBC: 4.2 10*3/uL (ref 3.8–10.8)

## 2023-06-19 LAB — COMPLETE METABOLIC PANEL WITH GFR
AG Ratio: 1.5 (calc) (ref 1.0–2.5)
ALT: 42 U/L — ABNORMAL HIGH (ref 6–29)
AST: 21 U/L (ref 10–30)
Albumin: 3.9 g/dL (ref 3.6–5.1)
Alkaline phosphatase (APISO): 49 U/L (ref 31–125)
BUN: 9 mg/dL (ref 7–25)
CO2: 25 mmol/L (ref 20–32)
Calcium: 8.7 mg/dL (ref 8.6–10.2)
Chloride: 103 mmol/L (ref 98–110)
Creat: 0.9 mg/dL (ref 0.50–0.99)
Globulin: 2.6 g/dL (calc) (ref 1.9–3.7)
Glucose, Bld: 239 mg/dL — ABNORMAL HIGH (ref 65–99)
Potassium: 3.6 mmol/L (ref 3.5–5.3)
Sodium: 137 mmol/L (ref 135–146)
Total Bilirubin: 0.4 mg/dL (ref 0.2–1.2)
Total Protein: 6.5 g/dL (ref 6.1–8.1)
eGFR: 82 mL/min/{1.73_m2} (ref >=60)

## 2023-06-19 LAB — HIV-1 RNA QUANT-NO REFLEX-BLD
HIV 1 RNA Quant: 29 {copies}/mL — ABNORMAL HIGH
HIV-1 RNA Quant, Log: 1.46 {Log_copies}/mL — ABNORMAL HIGH

## 2023-06-21 ENCOUNTER — Other Ambulatory Visit: Payer: Self-pay

## 2023-06-29 ENCOUNTER — Other Ambulatory Visit (HOSPITAL_COMMUNITY): Payer: Self-pay

## 2023-07-15 ENCOUNTER — Encounter: Payer: Self-pay | Admitting: Infectious Diseases

## 2023-07-15 ENCOUNTER — Ambulatory Visit (INDEPENDENT_AMBULATORY_CARE_PROVIDER_SITE_OTHER): Payer: Self-pay | Admitting: Infectious Diseases

## 2023-07-15 ENCOUNTER — Telehealth: Payer: Self-pay

## 2023-07-15 ENCOUNTER — Other Ambulatory Visit (HOSPITAL_COMMUNITY): Payer: Self-pay

## 2023-07-15 ENCOUNTER — Other Ambulatory Visit: Payer: Self-pay

## 2023-07-15 VITALS — BP 126/84 | HR 56 | Temp 98.4°F | Resp 16 | Wt 199.0 lb

## 2023-07-15 DIAGNOSIS — Z124 Encounter for screening for malignant neoplasm of cervix: Secondary | ICD-10-CM

## 2023-07-15 DIAGNOSIS — M654 Radial styloid tenosynovitis [de Quervain]: Secondary | ICD-10-CM

## 2023-07-15 DIAGNOSIS — B20 Human immunodeficiency virus [HIV] disease: Secondary | ICD-10-CM

## 2023-07-15 DIAGNOSIS — Z23 Encounter for immunization: Secondary | ICD-10-CM

## 2023-07-15 MED ORDER — EMTRICITAB-RILPIVIR-TENOFOV AF 200-25-25 MG PO TABS: 1.0000 | ORAL_TABLET | Freq: Every day | ORAL | 11 refills | Status: DC

## 2023-07-15 MED ORDER — PREDNISONE 20 MG PO TABS: ORAL_TABLET | ORAL | 0 refills | Status: AC

## 2023-07-15 NOTE — Telephone Encounter (Signed)
Attempted to call patient's daughter to review message from provider.  Marcia Long was not present at time of call. Attempted to use phone interpreter, but was told all [place] interpreter's were with patients.  Will try again at a later time. Juanita Laster, RMA

## 2023-07-15 NOTE — Addendum Note (Signed)
Addended by: Marcell Anger on: 07/15/2023 01:56 PM   Modules accepted: Orders

## 2023-07-15 NOTE — Progress Notes (Signed)
Name: Marcia Long  DOB: [birth year] MRN: [id number] PCP: Grayce Sessions, NP     Brief Narrative:  Marcia Long is a 42 y.o. female with HIV disease, dx 2006. Immigrated from [place] 07-2015 as refugee. Native language [place]ango. In care at [facility] previously. Married, husband HIV+.  History of OIs: unknown.  Previous Regimens: Odefsey 2017 >> undetectable Tivicay + Truvada during pregnancy 2019/2020 Tivicay + Descovy 03-2019 Biktarvy 2022 >> rash Odefsey 2022  Genotypes: 2017 chart records indicate no major RT, PI, INSTI mutations   Subjective:   Chief Complaint  Patient presents with   Follow-up    B20       HPI:  [place] interpretor present during the visit today via telephone.   Has been getting her Odefsey filled regularly from [facility] up until end of July - she has since lost Medicaid - she has a letter with her today that shows she has been downgraded to family planning medicaid only as of 09/2023. She is not sure who to call about this and feels it is wrong.  New problem with right wrist pain - her hands have been bothering her - a wound inside she describes it. She is having a hard time carrying things in the right hand  Has been going on about 3 months . No injury to her knowledge.  Tendon over right thumb painful to the touch.  Has tried to put ice on it which has not helped too much. She has a hard time carrying young kids.   She has a few other pieces of mail with her today to see if I can help her with.   Review of Systems  Respiratory: Negative.    Cardiovascular: Negative.   Gastrointestinal: Negative.   Genitourinary: Negative.   Musculoskeletal:  Positive for joint pain.  Skin:  Negative for rash.    Past Medical History:  Diagnosis Date   HIV (human immunodeficiency virus infection) ([facility])    HIV (human immunodeficiency virus infection) ([facility]) 11/30/2018   Dx 2006 HLA neg Quantiferon neg Hep B sAg neg       Outpatient Medications Prior to Visit  Medication Sig Dispense Refill   acetaminophen (TYLENOL) 325 MG tablet Take 2 tablets (650 mg total) by mouth every 6 (six) hours as needed.     albuterol (VENTOLIN HFA) 108 (90 Base) MCG/ACT inhaler Inhale 1-2 puffs into the lungs every 6 (six) hours as needed for wheezing or shortness of breath. 18 g 6   diclofenac Sodium (VOLTAREN) 1 % GEL Apply 4 g topically 4 (four) times daily. 350 g 1   ibuprofen (ADVIL) 600 MG tablet Take 1 tablet (600 mg total) by mouth every 6 (six) hours as needed. 30 tablet 0   emtricitabine-rilpivir-tenofovir AF (ODEFSEY) 200-25-25 MG TABS tablet Take 1 tablet by mouth daily with breakfast. Try to take at the same time each day with a full meal 30 tablet 11   tiZANidine (ZANAFLEX) 4 MG tablet Take 1 tablet (4 mg total) by mouth every 8 (eight) hours as needed. (Patient not taking: Reported on 01/23/2022) 30 tablet 0   No facility-administered medications prior to visit.     Allergies  Allergen Reactions   Bictegravir Rash    Social History   Tobacco Use   Smoking status: Never    Passive exposure: Never   Smokeless tobacco: Never  Vaping Use   Vaping status: Never Used  Substance Use Topics   Alcohol  use: Not Currently   Drug use: Not Currently    Social History   Substance and Sexual Activity  Sexual Activity Yes   Birth control/protection: None     Objective:   Vitals:   07/15/23 1033  BP: 126/84  Pulse: (!) 56  Resp: 16  Temp: 98.4 F (36.9 C)  TempSrc: Oral  SpO2: 99%  Weight: 199 lb (90.3 kg)   Body mass index is 34.16 kg/m.    Physical Exam Constitutional:      Appearance: Normal appearance. She is not ill-appearing.  HENT:     Mouth/Throat:     Mouth: Mucous membranes are moist.     Pharynx: Oropharynx is clear.  Eyes:     General: No scleral icterus. Pulmonary:     Effort: Pulmonary effort is normal.  Musculoskeletal:       Hands:     Comments: Tenderness    Neurological:     Mental Status: She is oriented to person, place, and time.  Psychiatric:        Mood and Affect: Mood normal.        Thought Content: Thought content normal.      Lab Results Lab Results  Component Value Date   WBC 4.2 06/17/2023   HGB 12.1 06/17/2023   HCT 36.7 06/17/2023   MCV 87.0 06/17/2023   PLT 261 06/17/2023    Lab Results  Component Value Date   CREATININE 0.90 06/17/2023   BUN 9 06/17/2023   NA 137 06/17/2023   K 3.6 06/17/2023   CL 103 06/17/2023   CO2 25 06/17/2023    Lab Results  Component Value Date   ALT 42 (H) 06/17/2023   AST 21 06/17/2023   ALKPHOS 63 03/11/2022   BILITOT 0.4 06/17/2023    Lab Results  Component Value Date   CHOL 156 01/05/2023   HDL 62 01/05/2023   LDLCALC 80 01/05/2023   TRIG 64 01/05/2023   CHOLHDL 2.5 01/05/2023   HIV 1 RNA Quant (Copies/mL)  Date Value  06/17/2023 29 (H)  01/05/2023 Not Detected  01/23/2022 <20 (H)   CD4 T Cell Abs (/uL)  Date Value  06/17/2023 919  01/05/2023 675  08/18/2021 830     Assessment & Plan:   Problem List Items Addressed This Visit       High   Human immunodeficiency virus (HIV) disease ([facility]) - Primary (Chronic)    Well controlled on Odefsey with last VL 29 copies, undetectable. CD4 looks great and > 800 consistently  We had to help with UMAP supplementation since medicaid has been deactivated - which I suspect is an error. I asked her to please have her oldest daughter call the case manager and see what changed as to why she was downgraded. She has 6 dependents at home and is not working, I imaging she qualifies.   We have to send her Rx's to new specialty pharmacy and helped communicate where to pick up.   Return in about 3 months (around 10/14/2023). We need to discuss statin tx and further recs re: hyperglycemia noted on labs recently.        Relevant Medications   emtricitabine-rilpivir-tenofovir AF (ODEFSEY) 200-25-25 MG TABS tablet     Unprioritized    Screening for cervical cancer, due 2025    Due 11/2023      De Quervain's tenosynovitis, right    History and exam c/w Suzette Battiest of the right radial tendons. He has some swelling/fullness and tenderness directly  over this area.   Conservative management with spica splint, will do prednisone course for 8d then ibuprofen for inflammation, rest, ice/massage. Sports medicine referral if no improvement or worsened symptoms over the next month.        Rexene Alberts, MSN, NP-C [facility] for Infect[facility] Pager: [phone] Office: [phone]   Total Encounter: 43 minutes spent in direct discussion with the patient   07/15/23  12:43 PM

## 2023-07-15 NOTE — Assessment & Plan Note (Signed)
Well controlled on Odefsey with last VL 29 copies, undetectable. CD4 looks great and > 800 consistently  We had to help with UMAP supplementation since medicaid has been deactivated - which I suspect is an error. I asked her to please have her oldest daughter call the case manager and see what changed as to why she was downgraded. She has 6 dependents at home and is not working, I imaging she qualifies.   We have to send her Rx's to new specialty pharmacy and helped communicate where to pick up.   Return in about 3 months (around 10/14/2023). We need to discuss statin tx and further recs re: hyperglycemia noted on labs recently.

## 2023-07-15 NOTE — Assessment & Plan Note (Signed)
Due 11/2023

## 2023-07-15 NOTE — Assessment & Plan Note (Addendum)
History and exam c/w Suzette Battiest of the right radial tendons. He has some swelling/fullness and tenderness directly over this area.   Conservative management with spica splint, will do prednisone course for 8d then ibuprofen for inflammation, rest, ice/massage. Sports medicine referral if no improvement or worsened symptoms over the next month.

## 2023-07-15 NOTE — Telephone Encounter (Signed)
-----   Message from [place] sent at 07/15/2023 12:43 PM EDT ----- Hi all - I was trying to remember the name of a splint for her thumb pain and I found which it was - it's called a spica splint - can pick one up at [facility] or pharmacy. After she is done with the prednisone she can use ibuprofen 3 tablets every 8 hours with food. It looks like she will probably have symptoms for months even after treatment.  If things are getting worse over the next month, we can put a referral in for sports medicine for her to see if she needs a steroid injection to help with the inflammation.   Can you please try to reach her daughter, Dale Marlow at their home number to get that message to her? She can help with translation. I am also happy to send her an email for reference if they want that and give permission.

## 2023-07-15 NOTE — Patient Instructions (Addendum)
   For the pain medication for your hand -  Take TWO tablets every morning for 2 days, THEN ONE tablet every morning for 2 days, THEN HALF A TABLET every morning until gone.   Ice will be helpful as well.   Your Medicaid Case worker is Joannie Springs - you can call her at [phone].  I think you qualify for the full Medicaid and something sounds incorrect - the letter says that   Call

## 2023-07-19 NOTE — Telephone Encounter (Signed)
Patient daughter Dale [place] called stating that prednisone isn't helping. Informed her it may take time to kick in but to continue taking prednisone as prescribed then start ibuprofen 3 tablets q8h. Also gave information regarding spica splint and the message below regarding still possibly having symptoms for months after tx and if no improvement in one month - possible referral to orthopedic. Naohmi voiced her understanding and will relay message to her mother.    Remmie Bembenek Lesli Albee, CMA

## 2023-07-22 ENCOUNTER — Encounter: Payer: Self-pay | Admitting: Infectious Diseases

## 2023-07-22 ENCOUNTER — Other Ambulatory Visit: Payer: Self-pay

## 2023-07-22 ENCOUNTER — Ambulatory Visit (INDEPENDENT_AMBULATORY_CARE_PROVIDER_SITE_OTHER): Payer: Self-pay | Admitting: Infectious Diseases

## 2023-07-22 VITALS — BP 130/84 | HR 54 | Temp 98.0°F | Wt 197.0 lb

## 2023-07-22 DIAGNOSIS — M654 Radial styloid tenosynovitis [de Quervain]: Secondary | ICD-10-CM

## 2023-07-22 DIAGNOSIS — L299 Pruritus, unspecified: Secondary | ICD-10-CM

## 2023-07-22 NOTE — Progress Notes (Signed)
Name: Marcia Long  DOB: 11-17-1980 MRN: [id number] PCP: Grayce Sessions, NP     Brief Narrative:  Marcia Long is a 42 y.o. female with HIV disease, dx 2006. Immigrated from [place] 07-2015 as refugee. Native language Sango. In care at [facility] previously. Married, husband HIV+.  History of OIs: unknown.  Previous Regimens: Odefsey 2017 >> undetectable Tivicay + Truvada during pregnancy 2019/2020 Tivicay + Descovy 03-2019 Biktarvy 2022 >> rash Odefsey 2022  Genotypes: 2017 chart records indicate no major RT, PI, INSTI mutations   Subjective:   Chief Complaint  Patient presents with   Follow-up      HPI:  Marcia Long is here today after being taken out of work for sudden onset whole body itching a few nights ago during her break at 7 PM.  She says she is never experienced this before.  Had no rashes, no swelling of her eyes face tongue.  She is not quite sure what triggered this sensation.  It has since resolved.  She has paperwork with her today to return to work.   She was not able to pick up the prednisone as prescribed for her right wrist pain.  She did pick up the brace that we recommended.  She is icing the area which does help . she does repetitive wrist twisting motions which really aggravates the pain.  No other changes we saw her.    Review of Systems  Respiratory: Negative.    Cardiovascular: Negative.   Gastrointestinal: Negative.   Genitourinary: Negative.   Musculoskeletal:  Positive for joint pain.  Skin:  Negative for rash.    Past Medical History:  Diagnosis Date   HIV (human immunodeficiency virus infection) (HCC)    HIV (human immunodeficiency virus infection) (HCC) 11/30/2018   Dx 2006 HLA neg Quantiferon neg Hep B sAg neg      Outpatient Medications Prior to Visit  Medication Sig Dispense Refill   acetaminophen (TYLENOL) 325 MG tablet Take 2 tablets (650 mg total) by mouth every 6 (six) hours as  needed.     albuterol (VENTOLIN HFA) 108 (90 Base) MCG/ACT inhaler Inhale 1-2 puffs into the lungs every 6 (six) hours as needed for wheezing or shortness of breath. 18 g 6   diclofenac Sodium (VOLTAREN) 1 % GEL Apply 4 g topically 4 (four) times daily. 350 g 1   emtricitabine-rilpivir-tenofovir AF (ODEFSEY) 200-25-25 MG TABS tablet Take 1 tablet by mouth daily with breakfast. Try to take at the same time each day with a full meal 30 tablet 11   ibuprofen (ADVIL) 600 MG tablet Take 1 tablet (600 mg total) by mouth every 6 (six) hours as needed. 30 tablet 0   predniSONE (DELTASONE) 20 MG tablet Take 2 tablets (40 mg total) by mouth daily with breakfast for 2 days, THEN 1 tablet (20 mg total) daily with breakfast for 2 days, THEN 0.5 tablets (10 mg total) daily with breakfast for 4 days. 8 tablet 0   tiZANidine (ZANAFLEX) 4 MG tablet Take 1 tablet (4 mg total) by mouth every 8 (eight) hours as needed. (Patient not taking: Reported on 01/23/2022) 30 tablet 0   No facility-administered medications prior to visit.     Allergies  Allergen Reactions   Bictegravir Rash    Social History   Tobacco Use   Smoking status: Never    Passive exposure: Never   Smokeless tobacco: Never  Vaping Use   Vaping status: Never Used  Substance Use Topics   Alcohol use: Not Currently   Drug use: Not Currently    Social History   Substance and Sexual Activity  Sexual Activity Yes   Birth control/protection: None     Objective:   Vitals:   07/22/23 0850  BP: 130/84  Pulse: (!) 54  Temp: 98 F (36.7 C)  TempSrc: Oral  SpO2: 98%  Weight: 197 lb (89.4 kg)   Body mass index is 33.81 kg/m.    Physical Exam Constitutional:      Appearance: Normal appearance. She is not ill-appearing.  HENT:     Mouth/Throat:     Mouth: Mucous membranes are moist.     Pharynx: Oropharynx is clear.  Eyes:     General: No scleral icterus. Pulmonary:     Effort: Pulmonary effort is normal.  Musculoskeletal:        Hands:     Comments: Tenderness   Neurological:     Mental Status: She is oriented to person, place, and time.  Psychiatric:        Mood and Affect: Mood normal.        Thought Content: Thought content normal.      Lab Results Lab Results  Component Value Date   WBC 4.2 06/17/2023   HGB 12.1 06/17/2023   HCT 36.7 06/17/2023   MCV 87.0 06/17/2023   PLT 261 06/17/2023    Lab Results  Component Value Date   CREATININE 0.90 06/17/2023   BUN 9 06/17/2023   NA 137 06/17/2023   K 3.6 06/17/2023   CL 103 06/17/2023   CO2 25 06/17/2023    Lab Results  Component Value Date   ALT 42 (H) 06/17/2023   AST 21 06/17/2023   ALKPHOS 63 03/11/2022   BILITOT 0.4 06/17/2023    Lab Results  Component Value Date   CHOL 156 01/05/2023   HDL 62 01/05/2023   LDLCALC 80 01/05/2023   TRIG 64 01/05/2023   CHOLHDL 2.5 01/05/2023   HIV 1 RNA Quant (Copies/mL)  Date Value  06/17/2023 29 (H)  01/05/2023 Not Detected  01/23/2022 <20 (H)   CD4 T Cell Abs (/uL)  Date Value  06/17/2023 919  01/05/2023 675  08/18/2021 830     Assessment & Plan:   Problem List Items Addressed This Visit       Unprioritized   De Quervain's tenosynovitis, right - Primary    Discussed use of ibuprofen 3 tablets every 8 hours for pain as needed with food.  I went ahead and scheduled her an appointment with the sports medicine team at [facility] off of [facility].  I communicated all information for the appointment to [facility] and wrote it down for her before she left.  Continue spica brace, icing.  I will write a note for her to take to work to avoid wrist twisting repetitive motion on the right and to allow her to wear her brace at work. Further recommendations to come evaluation at sports medicine. Helping set up the phone interpreter given such a rare dialect and challenges in the past.       Itching    It is not clear what caused her itching at work.  If this happens again we will investigate  further with allergy asthma.  She can return to work on Monday the 30th.  Paperwork completed and sent with her today.        Rexene Alberts, MSN, NP-C [facility] for Infectious Disease [facility]  Group Pager: [phone] Office: [phone]   07/22/23  9:36 AM

## 2023-07-22 NOTE — Patient Instructions (Addendum)
Will write a return to work Monday 07/26/23  I am going to see if they can accommodate your wrist - will ask them to avoid twisting motion of wrist for 3 months.   Ibuprofen - 3 tablets every 8 hours as needed with food for pain in the wrist  I am going to put a referral in for sports medicine to see you for your wrist - you may need a medication called a steroid shot to help the swelling  Wear the brace all the time at work for your.   You have an appointment at the Sports Medicine Clinic August 10, 2023 at 10:00 am with Dr. Denyse Amass  I am going to work on booking an interpretor for your visit. If you need to reschedule call them at [phone].   Address: [address]

## 2023-07-22 NOTE — Assessment & Plan Note (Signed)
It is not clear what caused her itching at work.  If this happens again we will investigate further with allergy asthma.  She can return to work on Monday the 30th.  Paperwork completed and sent with her today.

## 2023-07-22 NOTE — Assessment & Plan Note (Signed)
Discussed use of ibuprofen 3 tablets every 8 hours for pain as needed with food.  I went ahead and scheduled her an appointment with the sports medicine team at [facility] off of [facility].  I communicated all information for the appointment to [facility] and wrote it down for her before she left.  Continue spica brace, icing.  I will write a note for her to take to work to avoid wrist twisting repetitive motion on the right and to allow her to wear her brace at work. Further recommendations to come evaluation at sports medicine. Helping set up the phone interpreter given such a rare dialect and challenges in the past.

## 2023-07-27 ENCOUNTER — Telehealth: Payer: Self-pay

## 2023-07-27 NOTE — Telephone Encounter (Signed)
Patient's daughter [name] called wanting to schedule an appointment for Marcia Long. She reports [name] was sent home from work and they will not allow her to return until she sees the provider again.   Sandie Ano, RN

## 2023-07-27 NOTE — Telephone Encounter (Signed)
Called Nahomie back to discuss appointment and try to get additional information from employer, no answer. Left HIPAA compliant voicemail requesting callback.   Nahomie [phone]  Sandie Ano, RN

## 2023-07-29 NOTE — Telephone Encounter (Signed)
Got in touch with Marcia Long. She states Marcia Long was sent home from work after giving her employer the letter from our office. They gave her a form that needs to be completed and returned before 10/15 regarding her wrist.   Marcia Long is unsure what specifically the form is asking for, but mentions it has to do with "exercises." She states that Marcia Long is currently asleep and she is not able to send a picture of the form via MyChart or email for review.   Sandie Ano, RN

## 2023-07-29 NOTE — Telephone Encounter (Signed)
Spoke with Nahomie and scheduled Dawnelle for 10/9. Requested [place] interpreter for appointment time, awaiting confirmation email.   Sandie Ano, RN

## 2023-08-04 ENCOUNTER — Ambulatory Visit: Payer: Self-pay | Admitting: Family

## 2023-08-09 ENCOUNTER — Encounter: Payer: Self-pay | Admitting: Family

## 2023-08-09 ENCOUNTER — Other Ambulatory Visit: Payer: Self-pay

## 2023-08-09 ENCOUNTER — Ambulatory Visit (INDEPENDENT_AMBULATORY_CARE_PROVIDER_SITE_OTHER): Payer: Self-pay | Admitting: Family

## 2023-08-09 VITALS — BP 132/81 | HR 58 | Temp 98.0°F

## 2023-08-09 DIAGNOSIS — Z0289 Encounter for other administrative examinations: Secondary | ICD-10-CM

## 2023-08-09 DIAGNOSIS — Z603 Acculturation difficulty: Secondary | ICD-10-CM

## 2023-08-09 DIAGNOSIS — M654 Radial styloid tenosynovitis [de Quervain]: Secondary | ICD-10-CM

## 2023-08-09 NOTE — Assessment & Plan Note (Signed)
FMLA paperwork completed to the best of my ability while awaiting Sports Medicine evaluation and recommendations for additional treatment which is scheduled for tomorrow. Given likely tendinitis suspect she will have flare ups that will wax and wane. Will adjust FMLA paper work accordingly.

## 2023-08-09 NOTE — Patient Instructions (Signed)
Nice to meet you.  We have completed the forms the best we can pending your Sports Medicine appointment tomorrow with Dr. Denyse Amass who may make changes to our documents as needed.  Please continue to take your medication as prescribed.   Ice x 20 minutes and wear your thumb spica brace.  Follow up with Rexene Alberts, NP as scheduled.

## 2023-08-09 NOTE — Progress Notes (Signed)
Patient ID: Marcia Long, female    DOB: 1981/04/27, 42 y.o.   MRN: [id number]  Subjective:    Chief Complaint  Patient presents with   Form Completion    HPI:  Marcia Long is a 42 y.o. female with HIV disease last seen on 07/22/23 by Rexene Alberts, NP for itching and found to have right wrist/thumb pain concerning for DeQuervain with referral to Sports Medicine and instructions for ice and bracing. Marcia Long speaks Marcia Long and a medical interpreter was unavailable for appointment with her daughter present via phone to translate. Here today for paper work completion.   Marcia Long has been out of work since 07/22/23 when she was last seen and continues to have right wrist/thumb pain that has been going on for a total of about 5 months now.. She has a brace at home and is not currently wearing it. Has been taking ibuprofen with little to no improvement. Unclear if she has been icing since her last appointment. No new trauma or injury. Asking for FMLA paperwork to be completed for her job.  Has appointment scheduled with Sports Medicine tomorrow for further evaluation and treatment.   Denies fevers, chills, night sweats, headaches, changes in vision, neck pain/stiffness, nausea, diarrhea, vomiting, lesions or rashes.  Lab Results  Component Value Date   CD4TCELL 47 06/17/2023   CD4TABS 919 06/17/2023   Lab Results  Component Value Date   HIV1RNAQUANT 29 (H) 06/17/2023     Allergies  Allergen Reactions   Bictegravir Rash      Outpatient Medications Prior to Visit  Medication Sig Dispense Refill   acetaminophen (TYLENOL) 325 MG tablet Take 2 tablets (650 mg total) by mouth every 6 (six) hours as needed.     albuterol (VENTOLIN HFA) 108 (90 Base) MCG/ACT inhaler Inhale 1-2 puffs into the lungs every 6 (six) hours as needed for wheezing or shortness of breath. 18 g 6   diclofenac Sodium (VOLTAREN) 1 % GEL Apply 4 g topically 4 (four) times daily. 350 g 1    emtricitabine-rilpivir-tenofovir AF (ODEFSEY) 200-25-25 MG TABS tablet Take 1 tablet by mouth daily with breakfast. Try to take at the same time each day with a full meal 30 tablet 11   ibuprofen (ADVIL) 600 MG tablet Take 1 tablet (600 mg total) by mouth every 6 (six) hours as needed. 30 tablet 0   tiZANidine (ZANAFLEX) 4 MG tablet Take 1 tablet (4 mg total) by mouth every 8 (eight) hours as needed. (Patient not taking: Reported on 01/23/2022) 30 tablet 0   No facility-administered medications prior to visit.     Past Medical History:  Diagnosis Date   HIV (human immunodeficiency virus infection) (HCC)    HIV (human immunodeficiency virus infection) (HCC) 11/30/2018   Dx 2006 HLA neg Quantiferon neg Hep B sAg neg       Past Surgical History:  Procedure Laterality Date   CESAREAN SECTION N/A 02/24/2019   Procedure: CESAREAN SECTION;  Surgeon: [place] Bing, MD;  Location: [facility] LD ORS;  Service: Obstetrics;  Laterality: N/A;   NO PAST SURGERIES        Review of Systems  Constitutional:  Negative for chills, diaphoresis, fatigue, fever and unexpected weight change.  Eyes:        Negative for acute change in vision  Musculoskeletal:  Positive for joint swelling (Right thumb/wrist).  Neurological:  Negative for weakness.      Objective:    BP 132/81   Pulse (!) 58  Temp 98 F (36.7 C) (Temporal)   SpO2 98%  Nursing note and vital signs reviewed.  Physical Exam Constitutional:      General: She is not in acute distress.    Appearance: She is well-developed.  Cardiovascular:     Rate and Rhythm: Normal rate and regular rhythm.     Heart sounds: Normal heart sounds.  Pulmonary:     Effort: Pulmonary effort is normal.     Breath sounds: Normal breath sounds.  Musculoskeletal:     Comments: Right wrist appears with edema and no obvious deformity or discoloration. There is tenderness over thumb musculature and anatomical snuff box. Pulses and sensation intact and appropriate.    Skin:    General: Skin is warm and dry.  Neurological:     Mental Status: She is alert.         01/05/2023    9:31 AM 08/14/2022    9:11 AM 01/23/2022    2:02 PM 07/18/2020   10:53 AM 01/06/2019   11:04 AM  Depression screen PHQ 2/9  Decreased Interest  3 0 0 0  Down, Depressed, Hopeless 1 0 0 0 0  PHQ - 2 Score 1 3 0 0 0  Altered sleeping  0   0  Tired, decreased energy  0   0  Change in appetite  0   0  Feeling bad or failure about yourself   0   0  Trouble concentrating  0   0  Moving slowly or fidgety/restless  0   0  Suicidal thoughts  0   0  PHQ-9 Score  3   0       Assessment & Plan:    Patient Active Problem List   Diagnosis Date Noted   Itching 07/22/2023   De Quervain's tenosynovitis, right 07/15/2023   Amenorrhea 01/23/2022   Screening for cervical cancer, due 2025 12/12/2020   Elevated glucose level 08/14/2019   Encounter for completion of form with patient 04/23/2019   Human immunodeficiency virus (HIV) disease (HCC) 11/30/2018   Chronic dental pain 11/30/2018   Language barrier 11/03/2018     Problem List Items Addressed This Visit       Musculoskeletal and Integument   De Quervain's tenosynovitis, right - Primary    Ms. Bobb continues to have right wrist/thumb pain that has been unresolved with OTC ibuprofen. Unclear if she has been icing and is not currently wearing her brace/splint today. Await Sports Medicine appointment scheduled tomorrow. For now continue OTC anti-inflammatories, icing and home exercise therapy.         Other   Encounter for completion of form with patient    FMLA paperwork completed to the best of my ability while awaiting Sports Medicine evaluation and recommendations for additional treatment which is scheduled for tomorrow. Given likely tendinitis suspect she will have flare ups that will wax and wane. Will adjust FMLA paper work accordingly.         I am having Sherrice Scruton maintain her tiZANidine,  acetaminophen, ibuprofen, albuterol, diclofenac Sodium, and emtricitabine-rilpivir-tenofovir AF.   Follow-up: As planned or sooner if needed.    Marcos Eke, MSN, FNP-C Nurse Practitioner [facility] for Infectious Disease [facility] Main number: [phone]

## 2023-08-09 NOTE — Assessment & Plan Note (Signed)
Marcia Long primary preferred language is Sango and medical interpreter availability extremely limited. Daughter provided translation with her permission.

## 2023-08-09 NOTE — Assessment & Plan Note (Signed)
Ms. Wanninger continues to have right wrist/thumb pain that has been unresolved with OTC ibuprofen. Unclear if she has been icing and is not currently wearing her brace/splint today. Await Sports Medicine appointment scheduled tomorrow. For now continue OTC anti-inflammatories, icing and home exercise therapy.

## 2023-08-10 ENCOUNTER — Ambulatory Visit (INDEPENDENT_AMBULATORY_CARE_PROVIDER_SITE_OTHER): Payer: Self-pay | Admitting: Family Medicine

## 2023-08-10 ENCOUNTER — Other Ambulatory Visit: Payer: Self-pay

## 2023-08-10 VITALS — BP 162/96 | HR 98 | Ht 64.0 in | Wt 202.0 lb

## 2023-08-10 DIAGNOSIS — M654 Radial styloid tenosynovitis [de Quervain]: Secondary | ICD-10-CM

## 2023-08-10 NOTE — Patient Instructions (Addendum)
Thank you for coming in today.   You received an injection today. Seek immediate medical attention if the joint becomes red, extremely painful, or is oozing fluid  Wear the wrist brace  Please use Voltaren gel (Generic Diclofenac Gel) up to 4x daily for pain as needed.  This is available over-the-counter as both the name brand Voltaren gel and the generic diclofenac gel.  Check back in 1 month

## 2023-08-10 NOTE — Progress Notes (Signed)
   Rubin Payor, PhD, LAT, ATC acting as a scribe for Clementeen Graham, MD.  Marcia Long is a 42 y.o. female who presents to [facility] at [facility] today for R wrist and thumb pain ongoing for about 5 months. She has been out of work since 07/22/23. She used to work using [employer]. [employer]. Pt locates pain to the radial aspect of her R wrist and into the [facility] of the 1[facility].   Swelling: unsure Grip strength: if she is trying to pick something heavy up Aggravates: using scissors, carrying children Treatments tried: ice  Pertinent review of systems: No fevers or chills  Relevant historical information: Controlled HIV   Exam:  BP (!) 162/96   Pulse 98   Ht 5\' 4"  (1.626 m)   Wt 202 lb (91.6 kg)   SpO2 98%   BMI 34.67 kg/m  General: Well Developed, well nourished, and in no acute distress.   MSK: Right wrist some swelling at radial styloid area. Tender palpation of this region. Positive Finkelstein's test. Intact strength.    Lab and Radiology Results  Procedure: Real-time Ultrasound Guided Injection of right dorsal first wrist compartment tendon sheath (de Quervain's injection) Device: Philips Affiniti 50G/GE Logiq Images permanently stored and available for review in PACS Ultrasound evaluation prior to injection shows hypoechoic fluid surrounding first dorsal wrist compartment tendon within tendon sheath consistent with de Quervain's tenosynovitis. Verbal informed consent obtained.  Discussed risks and benefits of procedure. Warned about infection, bleeding, hyperglycemia damage to structures among others. Patient expresses understanding and agreement Time-out conducted.   Noted no overlying erythema, induration, or other signs of local infection.   Skin prepped in a sterile fashion.   Local anesthesia: Topical Ethyl chloride.   With sterile technique and under real time ultrasound guidance: 40 mg of Kenalog and 1 mL of lidocaine injected into  tendon sheath at first dorsal wrist compartment. Fluid seen entering the tendon sheath.   Completed without difficulty   Pain immediately resolved suggesting accurate placement of the medication.   Advised to call if fevers/chills, erythema, induration, drainage, or persistent bleeding.   Images permanently stored and available for review in the ultrasound unit.  Impression: Technically successful ultrasound guided injection.      Assessment and Plan: 42 y.o. female with right wrist pain.  This is a persistent ongoing problem for about 5 months.  Pain thought to be due to de Quervain's tenosynovitis.  She is already tried some bracing and conservative management.  Plan for steroid injection today.  Check back in 1 month.  Interpreter on the phone used for today's visit.    PDMP not reviewed this encounter. Orders Placed This Encounter  Procedures   [place] LIMITED JOINT SPACE STRUCTURES UP RIGHT(NO LINKED CHARGES)    Order Specific Question:   Reason for Exam (SYMPTOM  OR DIAGNOSIS REQUIRED)    Answer:   right wrist pain    Order Specific Question:   Preferred imaging location?    Answer:   [facility]   No orders of the defined types were placed in this encounter.    Discussed warning signs or symptoms. Please see discharge instructions. Patient expresses understanding.   The above documentation has been reviewed and is accurate and complete Clementeen Graham, M.D.

## 2023-08-16 ENCOUNTER — Telehealth: Payer: Self-pay | Admitting: Family Medicine

## 2023-08-16 NOTE — Telephone Encounter (Signed)
Based on the return to work note, her job is needing to know how long she will have to wear the brace?  Please advise.

## 2023-08-17 NOTE — Telephone Encounter (Signed)
Letter drafted and placed at the front desk for pt to pick up.

## 2023-08-17 NOTE — Telephone Encounter (Signed)
Left message informing patient.

## 2023-08-18 ENCOUNTER — Telehealth: Payer: Self-pay

## 2023-08-18 NOTE — Telephone Encounter (Signed)
Patient stopped by office to pickup Short Term Disability forms completed by Durwin Nora , Np.  Provided patient with completed paperwork along with letters from Sports Medicine.  Copy placed in scanning box and accordion folder in triage. Will fax form for patient.   F: [phone]  Juanita Laster, RMA

## 2023-09-07 ENCOUNTER — Ambulatory Visit (INDEPENDENT_AMBULATORY_CARE_PROVIDER_SITE_OTHER): Payer: Self-pay | Admitting: Family Medicine

## 2023-09-07 VITALS — BP 152/92 | HR 64 | Ht 64.0 in | Wt 203.0 lb

## 2023-09-07 DIAGNOSIS — M654 Radial styloid tenosynovitis [de Quervain]: Secondary | ICD-10-CM

## 2023-09-07 NOTE — Patient Instructions (Signed)
Thank you for coming in today.   Return as needed.    

## 2023-09-07 NOTE — Progress Notes (Signed)
   Rubin Payor, PhD, LAT, ATC acting as a scribe for Clementeen Graham, MD.  Marcia Long is a 42 y.o. female who presents to [facility] today for f/u R wrist pain thought to be due to [employer]. Pt was last seen by Dr. Denyse Amass on 08/10/23 and was given a R dorsal wrist steroid injection.  Today, pt reports she has been wearing the wrist brace. She notes improvement w/ her R wrist pain. Improved grip strength and motion.  She would like to return to work.  Pertinent review of systems: No fevers or chills  Relevant historical information: HIV controlled   Exam:  BP (!) 152/92   Pulse 64   Ht 5\' 4"  (1.626 m)   Wt 203 lb (92.1 kg)   SpO2 97%   BMI 34.84 kg/m  General: Well Developed, well nourished, and in no acute distress.   MSK: Right wrist minimal skin hypopigmentation.  Nontender radial styloid.  Normal wrist motion.       Assessment and Plan: 43 y.o. female with right de Quervain's tenosynovitis.  Improved with injection and bracing.  Return to work and check back with me as needed. Phone interpreter using the Sango language performed today during the visit.   PDMP not reviewed this encounter. No orders of the defined types were placed in this encounter.  No orders of the defined types were placed in this encounter.    Discussed warning signs or symptoms. Please see discharge instructions. Patient expresses understanding.   The above documentation has been reviewed and is accurate and complete Clementeen Graham, M.D.

## 2023-10-14 ENCOUNTER — Encounter: Payer: Self-pay | Admitting: Infectious Diseases

## 2023-10-14 ENCOUNTER — Other Ambulatory Visit: Payer: Self-pay

## 2023-10-14 ENCOUNTER — Ambulatory Visit (INDEPENDENT_AMBULATORY_CARE_PROVIDER_SITE_OTHER): Payer: Self-pay | Admitting: Infectious Diseases

## 2023-10-14 VITALS — BP 123/82 | HR 56 | Temp 97.3°F

## 2023-10-14 DIAGNOSIS — B20 Human immunodeficiency virus [HIV] disease: Secondary | ICD-10-CM

## 2023-10-14 DIAGNOSIS — R739 Hyperglycemia, unspecified: Secondary | ICD-10-CM

## 2023-10-14 DIAGNOSIS — R112 Nausea with vomiting, unspecified: Secondary | ICD-10-CM

## 2023-10-14 MED ORDER — METOCLOPRAMIDE HCL 10 MG PO TABS: 10.0000 mg | ORAL_TABLET | Freq: Three times a day (TID) | ORAL | 0 refills | Status: DC

## 2023-10-14 NOTE — Patient Instructions (Addendum)
Continue your Charlett Lango once a day with food if you can. If you need to hold off until your vomiting improves that is OK.   Start the reglan before you eat to see if that helps prevent the vomiting.   Please come back to see me on 11/11/2023 at 10:00

## 2023-10-14 NOTE — Progress Notes (Signed)
Name: Marcia Long  DOB: 05-03-1981 MRN: [id number] PCP: Grayce Sessions, NP     Brief Narrative:  [name] is a 42 y.o. female with HIV disease, dx 2006. Immigrated from [place] 07-2015 as refugee. Native language S[place]. In care at [facility] ID clinic previously. Married, husband HIV+.  History of OIs: unknown.  Previous Regimens: Odefsey 2017 >> undetectable Tivicay + Truvada during pregnancy 2019/2020 Tivicay + Descovy 03-2019 Biktarvy 2022 >> rash Odefsey 2022  Genotypes: 2017 chart records indicate no major RT, PI, INSTI mutations   Subjective:   Chief Complaint  Patient presents with   Follow-up     Discussed the use of AI scribe software for clinical note transcription with the patient, who gave verbal consent to proceed. Phone translator for [employer]  Interpretation present.   History of Present Illness   Marcia Long, with a history of well controlled HIV, presents with complaints of persistent nausea and vomiting for the past two weeks. The patient describes the vomiting as sometimes orange or green in color, but denies any black, coffee-colored, or bloody vomitus. She also reports intermittent fever and chills, but no one else at home has similar symptoms. The patient has been experiencing stomach pain, particularly in the lower abdomen. She reports normal bowel movements, with the last one being the previous day.  The patient also mentions a recent issue with obtaining her Odefsey medication from the pharmacy and has been out of this medication for the past week. She denies any new medications and reports only having an injection for her wrist injury. The patient has taken over-the-counter ibuprofen for a headache for two days in the past.  The patient's blood work from August showed a blood sugar level over 200, indicating possible diabetes. The patient is not sure if she has diabetes and is open to further testing. The patient works in  the afternoon and has children to take care of in the morning.      Review of Systems  Respiratory: Negative.    Cardiovascular: Negative.   Gastrointestinal: Negative.   Genitourinary: Negative.   Musculoskeletal:  Positive for joint pain.  Skin:  Negative for rash.    Past Medical History:  Diagnosis Date   HIV (human immunodeficiency virus infection) (HCC)    HIV (human immunodeficiency virus infection) (HCC) 11/30/2018   Dx 2006 HLA neg Quantiferon neg Hep B sAg neg      Outpatient Medications Prior to Visit  Medication Sig Dispense Refill   acetaminophen (TYLENOL) 325 MG tablet Take 2 tablets (650 mg total) by mouth every 6 (six) hours as needed.     emtricitabine-rilpivir-tenofovir AF (ODEFSEY) 200-25-25 MG TABS tablet Take 1 tablet by mouth daily with breakfast. Try to take at the same time each day with a full meal 30 tablet 11   albuterol (VENTOLIN HFA) 108 (90 Base) MCG/ACT inhaler Inhale 1-2 puffs into the lungs every 6 (six) hours as needed for wheezing or shortness of breath. (Patient not taking: Reported on 10/14/2023) 18 g 6   diclofenac Sodium (VOLTAREN) 1 % GEL Apply 4 g topically 4 (four) times daily. (Patient not taking: Reported on 10/14/2023) 350 g 1   ibuprofen (ADVIL) 600 MG tablet Take 1 tablet (600 mg total) by mouth every 6 (six) hours as needed. 30 tablet 0   tiZANidine (ZANAFLEX) 4 MG tablet Take 1 tablet (4 mg total) by mouth every 8 (eight) hours as needed. (Patient not taking: Reported on 10/14/2023) 30 tablet  0   No facility-administered medications prior to visit.     Allergies  Allergen Reactions   Bictegravir Rash    Social History   Tobacco Use   Smoking status: Never    Passive exposure: Never   Smokeless tobacco: Never  Vaping Use   Vaping status: Never Used  Substance Use Topics   Alcohol use: Not Currently   Drug use: Not Currently    Social History   Substance and Sexual Activity  Sexual Activity Yes   Birth  control/protection: None     Objective:   Vitals:   10/14/23 1044  BP: 123/82  Pulse: (!) 56  Temp: (!) 97.3 F (36.3 C)  TempSrc: Temporal  SpO2: 98%   There is no height or weight on file to calculate BMI.    Physical Exam Constitutional:      Appearance: Normal appearance. She is not ill-appearing.  HENT:     Mouth/Throat:     Mouth: Mucous membranes are moist.     Pharynx: Oropharynx is clear.  Eyes:     General: No scleral icterus. Pulmonary:     Effort: Pulmonary effort is normal.  Musculoskeletal:       Hands:     Comments: Tenderness   Neurological:     Mental Status: She is oriented to person, place, and time.  Psychiatric:        Mood and Affect: Mood normal.        Thought Content: Thought content normal.      Lab Results Lab Results  Component Value Date   WBC 4.2 06/17/2023   HGB 12.1 06/17/2023   HCT 36.7 06/17/2023   MCV 87.0 06/17/2023   PLT 261 06/17/2023    Lab Results  Component Value Date   CREATININE 0.90 06/17/2023   BUN 9 06/17/2023   NA 137 06/17/2023   K 3.6 06/17/2023   CL 103 06/17/2023   CO2 25 06/17/2023    Lab Results  Component Value Date   ALT 42 (H) 06/17/2023   AST 21 06/17/2023   ALKPHOS 63 03/11/2022   BILITOT 0.4 06/17/2023    Lab Results  Component Value Date   CHOL 156 01/05/2023   HDL 62 01/05/2023   LDLCALC 80 01/05/2023   TRIG 64 01/05/2023   CHOLHDL 2.5 01/05/2023   HIV 1 RNA Quant (Copies/mL)  Date Value  06/17/2023 29 (H)  01/05/2023 Not Detected  01/23/2022 <20 (H)   CD4 T Cell Abs (/uL)  Date Value  06/17/2023 919  01/05/2023 675  08/18/2021 830     Assessment & Plan:     Nausea and Vomiting -  Ongoing for two weeks with associated abdominal pain. No diarrhea or weight loss. No consistent use of NSAIDs per her report but I do wonder with her wrist injury if that was previously the case triggering possible gastritis. Seems odd to have lingering viral gastroenteritis. Does not  sound like constipation is a factor nor is diarrhea.  -Start Reglan before meals up to three times a day. -Order blood tests to check for inflammation or other abnormalities. -Avoid PPIs with Odefsey -BRAT diet / bland foods -Avoid coffee, soda, alcohol, spicy foods/drinks.  -Avoid nsaids -CMP, Amylase, Lipase today   HIV -  Patient has been off Odefsey for one week due to pharmacy issues. We called [facility] and decided with Lyndley today that the easiest solution is to have her daughter Narda Rutherford help to verify shipment of her medication to her. We  communicated this to her toay.  -Resolve pharmacy issue and ensure patient resumes Odefsey as soon as possible. -Avoid PPIs to ensure adequate absorption of Odefsey  -Pap smear up to date  -Vaccinations up to date   Hyperglycemia -  Recent blood tests showed elevated blood sugars. -Order a more comprehensive blood test to assess for diabetes. -Schedule follow-up appointment in one month to discuss results and potential treatment plan.      Meds ordered this encounter  Medications   metoCLOPramide (REGLAN) 10 MG tablet    Sig: Take 1 tablet (10 mg total) by mouth 3 (three) times daily before meals.    Dispense:  90 tablet    Refill:  0   Orders Placed This Encounter  Procedures   COMPLETE METABOLIC PANEL WITH GFR   Amylase   Lipase   T-helper cells (CD4) count   HIV 1 RNA quant-no reflex-bld   Hemoglobin A1c    FU in 63m scheduled - will arrange interpretor   Rexene Alberts, MSN, NP-C [facility] Pager: [phone] Office: [phone]   10/14/23  10:55 AM

## 2023-10-15 LAB — T-HELPER CELLS (CD4) COUNT (NOT AT ARMC)
CD4 % Helper T Cell: 46 % (ref 33–65)
CD4 T Cell Abs: 1036 /uL (ref 400–1790)

## 2023-10-17 LAB — AMYLASE: Amylase: 31 U/L (ref 21–101)

## 2023-10-17 LAB — HIV-1 RNA QUANT-NO REFLEX-BLD
HIV 1 RNA Quant: NOT DETECTED {copies}/mL
HIV-1 RNA Quant, Log: NOT DETECTED {Log}

## 2023-10-17 LAB — COMPLETE METABOLIC PANEL WITH GFR
AG Ratio: 1.4 (calc) (ref 1.0–2.5)
ALT: 18 U/L (ref 6–29)
AST: 12 U/L (ref 10–30)
Albumin: 4.2 g/dL (ref 3.6–5.1)
Alkaline phosphatase (APISO): 54 U/L (ref 31–125)
BUN: 14 mg/dL (ref 7–25)
CO2: 30 mmol/L (ref 20–32)
Calcium: 9.3 mg/dL (ref 8.6–10.2)
Chloride: 103 mmol/L (ref 98–110)
Creat: 0.82 mg/dL (ref 0.50–0.99)
Globulin: 2.9 g/dL (ref 1.9–3.7)
Glucose, Bld: 125 mg/dL — ABNORMAL HIGH (ref 65–99)
Potassium: 4.2 mmol/L (ref 3.5–5.3)
Sodium: 140 mmol/L (ref 135–146)
Total Bilirubin: 0.4 mg/dL (ref 0.2–1.2)
Total Protein: 7.1 g/dL (ref 6.1–8.1)
eGFR: 92 mL/min/{1.73_m2} (ref >=60)

## 2023-10-17 LAB — HEMOGLOBIN A1C
Hgb A1c MFr Bld: 6.7 %{Hb} — ABNORMAL HIGH (ref <5.7)
Mean Plasma Glucose: 146 mg/dL
eAG (mmol/L): 8.1 mmol/L

## 2023-10-17 LAB — LIPASE: Lipase: 12 U/L (ref 7–60)

## 2023-10-28 ENCOUNTER — Telehealth: Payer: Self-pay

## 2023-10-28 NOTE — Telephone Encounter (Signed)
 Attempted to call pt in regards to scheduling with Corean Fireman. No answer and left a voicemail.   We need to attempt to reschedule to another date due to interpreter services [facility]) limited availability. Received notification that services were unavailable for her appt time of 11/11/2023 at 10:00am.   (Requests are limited to set only 30 days in advance of the appt)  Interpreter services request:  Reference # [id number] Access Code: [phone]

## 2023-11-11 ENCOUNTER — Ambulatory Visit: Payer: Medicaid Other | Admitting: Infectious Diseases

## 2023-12-01 ENCOUNTER — Ambulatory Visit: Payer: Medicaid Other | Admitting: Infectious Diseases

## 2023-12-21 ENCOUNTER — Other Ambulatory Visit: Payer: Self-pay | Admitting: Infectious Diseases

## 2023-12-21 DIAGNOSIS — R112 Nausea with vomiting, unspecified: Secondary | ICD-10-CM

## 2023-12-21 NOTE — Telephone Encounter (Signed)
 Okay to refill?

## 2023-12-22 ENCOUNTER — Other Ambulatory Visit: Payer: Self-pay | Admitting: Infectious Diseases

## 2023-12-22 DIAGNOSIS — R112 Nausea with vomiting, unspecified: Secondary | ICD-10-CM

## 2024-01-04 ENCOUNTER — Encounter: Payer: Self-pay | Admitting: Infectious Diseases

## 2024-01-04 ENCOUNTER — Other Ambulatory Visit: Payer: Self-pay

## 2024-01-04 ENCOUNTER — Ambulatory Visit: Payer: Medicaid Other | Admitting: Infectious Diseases

## 2024-01-04 ENCOUNTER — Other Ambulatory Visit (HOSPITAL_COMMUNITY): Payer: Self-pay

## 2024-01-04 DIAGNOSIS — R112 Nausea with vomiting, unspecified: Secondary | ICD-10-CM

## 2024-01-04 DIAGNOSIS — B20 Human immunodeficiency virus [HIV] disease: Secondary | ICD-10-CM

## 2024-01-04 DIAGNOSIS — R7303 Prediabetes: Secondary | ICD-10-CM

## 2024-01-04 MED ORDER — METFORMIN HCL 500 MG PO TABS
500.0000 mg | ORAL_TABLET | Freq: Two times a day (BID) | ORAL | 2 refills | Status: DC
  Filled 2024-01-04: qty 60, 30d supply, fill #0

## 2024-01-04 MED ORDER — METOCLOPRAMIDE HCL 10 MG PO TABS
10.0000 mg | ORAL_TABLET | Freq: Four times a day (QID) | ORAL | 0 refills | Status: AC | PRN
  Filled 2024-01-04: qty 90, 23d supply, fill #0

## 2024-01-04 NOTE — Patient Instructions (Addendum)
 START Metformin -   For the first 2 weeks - take ONE tablet once a day with a full meal.   THEN   Take ONE tablet TWICE a day with two separate meals.   This is to help keep your blood sugar down to prevent diabetes.   Side effects - may have diarrhea or nausea / stomach upset. These should get better as you get used to it.  Please let me know if the diarrhea is too bad to tolerate and you cannot take this medication     Walking 10-30 minutes a day will also be helpful   Weight loss will also help to prevent diabetes for you too.

## 2024-01-04 NOTE — Progress Notes (Signed)
 Name: Marcia Long  DOB: Apr 15, 1981 MRN: [id number] PCP: Grayce Sessions, NP     Brief Narrative:  Marcia Long is a 42 y.o. female with HIV disease, dx 2006. Immigrated from [place] 07-2015 as refugee. Native language S[place]. In care at [facility] ID clinic previously. Married, husband HIV+.  History of OIs: unknown.  Previous Regimens: Odefsey 2017 >> undetectable Tivicay + Truvada during pregnancy 2019/2020 Tivicay + Descovy 03-2019 Biktarvy 2022 >> rash Odefsey 2022  Genotypes: 2017 chart records indicate no major RT, PI, INSTI mutations  Subjective   Subjective:   Chief Complaint  Patient presents with   Follow-up     Discussed the use of AI scribe software for clinical note transcription with the patient, who gave verbal consent to proceed. Phone translator for [employer]  Interpretation present.   History of Present Illness   The patient presents with increasing blood sugar levels.   Her blood sugar levels have been increasing over the past two years. She did not experience blood sugar issues during her pregnancies. She does mention some family history of diabetes but does not recall specific family members.   She plans to pick up her medication from the [facility].  She consumes a diet rich in meat, fish, and chicken, and reports eating a lot of bread. She avoids rice due to stomach discomfort. She does enjoy honey in her tea but not a huge fan of sweet desserts. She does not engage in formal exercise but is active with household chores. She has a daughter named [name] who assists with her healthcare needs.  She previously experienced nausea, which was managed with Phenergan. No current nausea, but she is open to refilling the medication in case it recurs.      Review of Systems  Respiratory: Negative.    Cardiovascular: Negative.   Gastrointestinal: Negative.   Genitourinary: Negative.   Musculoskeletal:  Negative for  joint pain.  Skin:  Negative for rash.    Past Medical History:  Diagnosis Date   HIV (human immunodeficiency virus infection) (HCC)    HIV (human immunodeficiency virus infection) (HCC) 11/30/2018   Dx 2006 HLA neg Quantiferon neg Hep B sAg neg      Outpatient Medications Prior to Visit  Medication Sig Dispense Refill   acetaminophen (TYLENOL) 325 MG tablet Take 2 tablets (650 mg total) by mouth every 6 (six) hours as needed.     emtricitabine-rilpivir-tenofovir AF (ODEFSEY) 200-25-25 MG TABS tablet Take 1 tablet by mouth daily with breakfast. Try to take at the same time each day with a full meal 30 tablet 11   ibuprofen (ADVIL) 600 MG tablet Take 1 tablet (600 mg total) by mouth every 6 (six) hours as needed. 30 tablet 0   metoCLOPramide (REGLAN) 10 MG tablet TAKE 1 TABLET(10 MG) BY MOUTH THREE TIMES DAILY BEFORE MEALS 90 tablet 0   No facility-administered medications prior to visit.     Allergies  Allergen Reactions   Bictegravir Rash    Social History   Tobacco Use   Smoking status: Never    Passive exposure: Never   Smokeless tobacco: Never  Vaping Use   Vaping status: Never Used  Substance Use Topics   Alcohol use: Not Currently   Drug use: Not Currently    Social History   Substance and Sexual Activity  Sexual Activity Yes   Birth control/protection: None     Objective     Objective:   Vitals:  01/04/24 1043  BP: 114/75  Pulse: 64  Temp: (!) 97.5 F (36.4 C)  TempSrc: Oral  SpO2: 100%  Weight: 201 lb (91.2 kg)   Body mass index is 34.5 kg/m.    Physical Exam Constitutional:      Appearance: Normal appearance. She is not ill-appearing.  HENT:     Mouth/Throat:     Mouth: Mucous membranes are moist.     Pharynx: Oropharynx is clear.  Eyes:     General: No scleral icterus. Cardiovascular:     Rate and Rhythm: Normal rate.  Pulmonary:     Effort: Pulmonary effort is normal.  Neurological:     Mental Status: She is oriented to  person, place, and time.  Psychiatric:        Mood and Affect: Mood normal.        Thought Content: Thought content normal.      Lab Results Lab Results  Component Value Date   WBC 4.2 06/17/2023   HGB 12.1 06/17/2023   HCT 36.7 06/17/2023   MCV 87.0 06/17/2023   PLT 261 06/17/2023    Lab Results  Component Value Date   CREATININE 0.82 10/14/2023   BUN 14 10/14/2023   NA 140 10/14/2023   K 4.2 10/14/2023   CL 103 10/14/2023   CO2 30 10/14/2023    Lab Results  Component Value Date   ALT 18 10/14/2023   AST 12 10/14/2023   ALKPHOS 63 03/11/2022   BILITOT 0.4 10/14/2023    Lab Results  Component Value Date   CHOL 156 01/05/2023   HDL 62 01/05/2023   LDLCALC 80 01/05/2023   TRIG 64 01/05/2023   CHOLHDL 2.5 01/05/2023   HIV 1 RNA Quant (Copies/mL)  Date Value  10/14/2023 Not Detected  06/17/2023 29 (H)  01/05/2023 Not Detected   CD4 T Cell Abs (/uL)  Date Value  10/14/2023 1,036  06/17/2023 919  01/05/2023 675       Assessment & Plan:     HIV infection - On Odefsey with no interaction with metformin. Viral load last checked in November undetectable. CD4 remains well recovered. No trouble with refilling her medication. She had flue shot. She is due for routine pap smear this year for cervical cancer monitoring. She can start mamograms for routine screening at 43 yo with no family history.   - Ensure continued prescription of Odefsey with adequate refills.   Prediabetes - Blood sugar levels increasing, risk for diabetes. Discussed blood sugar management, lifestyle changes, and metformin use. Explained metformin side effects and gradual dose increase. - Prescribe metformin, starting with one 500 mg tablet with a meal once a day for two weeks, then increase to one 500 mg tablet with a meal twice a day. - Provide written dietary guidance to avoid or limit sugary drinks and high-carbohydrate foods. - Encourage weight loss through dietary changes and exercise,  suggesting starting with two 10-minute walks per day. - Schedule follow-up appointment in three months to assess blood sugar levels and response to metformin.  Nausea - Previously managed with medication. Plans for refill if symptoms recur. - Send refill for nausea medication to the pharmacy.      Meds ordered this encounter  Medications   metoCLOPramide (REGLAN) 10 MG tablet    Sig: Take 1 tablet (10 mg total) by mouth every 6 (six) hours as needed for nausea.    Dispense:  90 tablet    Refill:  0   metFORMIN (GLUCOPHAGE) 500 MG  tablet    Sig: Take 1 tablet (500 mg total) by mouth 2 (two) times daily with a meal.    Dispense:  60 tablet    Refill:  2   No orders of the defined types were placed in this encounter.   I scheduled the next appointment with interpretor services on the phone today before our visit - she will send a confirmation email.  Interpreter through [employer] - Reference: [id number], Interpreter Jocelyne [phone]   Rexene Alberts, MSN, NP-C [facility] Pager: [phone] Office: [phone]   01/04/24  11:54 AM

## 2024-04-18 ENCOUNTER — Ambulatory Visit: Admitting: Infectious Diseases

## 2024-04-18 ENCOUNTER — Encounter: Payer: Self-pay | Admitting: Infectious Diseases

## 2024-04-18 ENCOUNTER — Other Ambulatory Visit: Payer: Self-pay

## 2024-04-18 VITALS — BP 118/77 | HR 62 | Temp 97.8°F | Wt 200.0 lb

## 2024-04-18 DIAGNOSIS — E119 Type 2 diabetes mellitus without complications: Secondary | ICD-10-CM | POA: Diagnosis not present

## 2024-04-18 DIAGNOSIS — Z558 Other problems related to education and literacy: Secondary | ICD-10-CM

## 2024-04-18 DIAGNOSIS — Z7984 Long term (current) use of oral hypoglycemic drugs: Secondary | ICD-10-CM

## 2024-04-18 DIAGNOSIS — B20 Human immunodeficiency virus [HIV] disease: Secondary | ICD-10-CM | POA: Diagnosis not present

## 2024-04-18 MED ORDER — METFORMIN HCL 500 MG PO TABS: 500.0000 mg | ORAL_TABLET | Freq: Two times a day (BID) | ORAL | 2 refills | Status: DC

## 2024-04-18 NOTE — Patient Instructions (Signed)
 Please bring your medication bottles with you next appointment   Will see you back in 3 months.

## 2024-04-18 NOTE — Progress Notes (Signed)
 Name: Marcia Long  DOB: 1980/12/26 MRN: [id number] PCP: Celestia Rosaline SQUIBB, NP     Brief Narrative:  Marcia Long is a 43 y.o. female with HIV disease, dx 2006. Immigrated from [place] 07-2015 as refugee. Native language [place]. In care at [facility] ID clinic previously. Married, husband HIV+.  History of OIs: unknown.  Previous Regimens: Odefsey  2017 >> undetectable Tivicay  + Truvada during pregnancy 2019/2020 Tivicay  + Descovy  03-2019 Biktarvy  2022 >> rash Odefsey  2022  Genotypes: 2017 chart records indicate no major RT, PI, INSTI mutations  Subjective   Subjective:   Chief Complaint  Patient presents with   Follow-up    B20     Discussed the use of AI scribe software for clinical note transcription with the patient, who gave verbal consent to proceed. Phone translator for [employer]  Interpretation present.   History of Present Illness   Marcia Long is a 43 year old female with HIV who presents for routine follow-up care. Telephone interpreter was present to facilitate [place] language barrier.   She continues to take Odefsey  once daily with food without issues in obtaining her medication, which she receives by mail. There are no concerns about adherence or side effects at this time.  She was previously started on metformin  for diabetes management. There is a concern about whether she is consistently receiving her medication, as there has been only one dispense recorded.  She mentions being given a medication intended to help with bowel movements, but it is not effective. She did not specify the name of the medication.      Review of Systems  Respiratory: Negative.    Cardiovascular: Negative.   Gastrointestinal: Negative.   Genitourinary: Negative.   Musculoskeletal:  Negative for joint pain.  Skin:  Negative for rash.    Past Medical History:  Diagnosis Date   HIV (human immunodeficiency virus infection) (HCC)    HIV (human  immunodeficiency virus infection) (HCC) 11/30/2018   Dx 2006 HLA neg Quantiferon neg Hep B sAg neg      Outpatient Medications Prior to Visit  Medication Sig Dispense Refill   acetaminophen  (TYLENOL ) 325 MG tablet Take 2 tablets (650 mg total) by mouth every 6 (six) hours as needed.     emtricitabine -rilpivir-tenofovir  AF (ODEFSEY ) 200-25-25 MG TABS tablet Take 1 tablet by mouth daily with breakfast. Try to take at the same time each day with a full meal 30 tablet 11   ibuprofen  (ADVIL ) 600 MG tablet Take 1 tablet (600 mg total) by mouth every 6 (six) hours as needed. 30 tablet 0   metoCLOPramide  (REGLAN ) 10 MG tablet Take 1 tablet (10 mg total) by mouth every 6 (six) hours as needed for nausea. 90 tablet 0   metFORMIN  (GLUCOPHAGE ) 500 MG tablet Take 1 tablet (500 mg total) by mouth 2 (two) times daily with a meal. 60 tablet 2   No facility-administered medications prior to visit.     Allergies  Allergen Reactions   Bictegravir Rash    Social History   Tobacco Use   Smoking status: Never    Passive exposure: Never   Smokeless tobacco: Never  Vaping Use   Vaping status: Never Used  Substance Use Topics   Alcohol use: Not Currently   Drug use: Not Currently    Social History   Substance and Sexual Activity  Sexual Activity Yes   Birth control/protection: None   Comment: declined condoms     Objective     Objective:  Vitals:   04/18/24 1051  BP: 118/77  Pulse: 62  Temp: 97.8 F (36.6 C)  TempSrc: Temporal  Weight: 200 lb (90.7 kg)   Body mass index is 34.33 kg/m.    Physical Exam Constitutional:      Appearance: Normal appearance. She is not ill-appearing.  HENT:     Mouth/Throat:     Mouth: Mucous membranes are moist.     Pharynx: Oropharynx is clear.   Eyes:     General: No scleral icterus.   Cardiovascular:     Rate and Rhythm: Normal rate.  Pulmonary:     Effort: Pulmonary effort is normal.   Neurological:     Mental Status: She is  oriented to person, place, and time.   Psychiatric:        Mood and Affect: Mood normal.        Thought Content: Thought content normal.      Lab Results Lab Results  Component Value Date   WBC 4.2 06/17/2023   HGB 12.1 06/17/2023   HCT 36.7 06/17/2023   MCV 87.0 06/17/2023   PLT 261 06/17/2023    Lab Results  Component Value Date   CREATININE 0.82 10/14/2023   BUN 14 10/14/2023   NA 140 10/14/2023   K 4.2 10/14/2023   CL 103 10/14/2023   CO2 30 10/14/2023    Lab Results  Component Value Date   ALT 18 10/14/2023   AST 12 10/14/2023   ALKPHOS 63 03/11/2022   BILITOT 0.4 10/14/2023    Lab Results  Component Value Date   CHOL 156 01/05/2023   HDL 62 01/05/2023   LDLCALC 80 01/05/2023   TRIG 64 01/05/2023   CHOLHDL 2.5 01/05/2023   HIV 1 RNA Quant (Copies/mL)  Date Value  10/14/2023 Not Detected  06/17/2023 29 (H)  01/05/2023 Not Detected   CD4 T Cell Abs (/uL)  Date Value  10/14/2023 1,036  06/17/2023 919  01/05/2023 675       Assessment & Plan:     HIV disease - Marcia Long is on Odefsey  for HIV management, taking it once daily with food. She reports no issues with medication access, either picking it up or receiving it by mail. No drug interactions noted. No concern for mood.  - Needs statin start next appt  - Cervical cancer screening due at follow up  - Continue Odefsey  once daily  - HIV VL today   Type 2 diabetes mellitus - Marcia Long was started on metformin  during her last visit for diabetes management. A 90-day supply was discussed to reduce refill frequency. - A1C today  - Adjust metformin  prescription to a 90-day supply. - Needs statin start next appt  - Urine microalbumin collected   Form Completion -  Letter provided for citizenship recommendation for accomodation based on neurocognitive evaluation in May 2025 that demonstrated a significant cognitive impairment that interferes with retention of new information and knowledge.   Recording  duration: 14 minutes  Professional Time spent in reviewing provided documentation from other assessor, writing letter of support, medication dispense history: 12 minutes  Total Time: 26 minutes      Meds ordered this encounter  Medications   metFORMIN  (GLUCOPHAGE ) 500 MG tablet    Sig: Take 1 tablet (500 mg total) by mouth 2 (two) times daily with a meal.    Dispense:  180 tablet    Refill:  2    Please mail to patient   Orders Placed This Encounter  Procedures  Hemoglobin A1c   HIV 1 RNA quant-no reflex-bld   Urine Microalbumin w/creat. ratio   COMPLETE METABOLIC PANEL WITHOUT GFR    Marcia Fireman, MSN, NP-C [facility] for Infectious Disease [facility] Pager: [phone] Office: [phone]   04/18/24  3:05 PM

## 2024-04-20 LAB — HIV-1 RNA QUANT-NO REFLEX-BLD: HIV 1 RNA Quant: NOT DETECTED {copies}/mL

## 2024-04-21 LAB — COMPLETE METABOLIC PANEL WITHOUT GFR
AG Ratio: 1.5 (calc) (ref 1.0–2.5)
ALT: 15 U/L (ref 6–29)
AST: 11 U/L (ref 10–30)
Albumin: 4 g/dL (ref 3.6–5.1)
Alkaline phosphatase (APISO): 48 U/L (ref 31–125)
BUN: 12 mg/dL (ref 7–25)
CO2: 27 mmol/L (ref 20–32)
Calcium: 8.6 mg/dL (ref 8.6–10.2)
Chloride: 106 mmol/L (ref 98–110)
Creat: 0.79 mg/dL (ref 0.50–0.99)
Globulin: 2.7 g/dL (ref 1.9–3.7)
Glucose, Bld: 123 mg/dL — ABNORMAL HIGH (ref 65–99)
Potassium: 3.9 mmol/L (ref 3.5–5.3)
Sodium: 139 mmol/L (ref 135–146)
Total Bilirubin: 0.4 mg/dL (ref 0.2–1.2)
Total Protein: 6.7 g/dL (ref 6.1–8.1)

## 2024-04-21 LAB — HIV-1 RNA QUANT-NO REFLEX-BLD: HIV-1 RNA Quant, Log: NOT DETECTED {Log_copies}/mL

## 2024-04-21 LAB — MICROALBUMIN / CREATININE URINE RATIO
Creatinine, Urine: 132 mg/dL (ref 20–275)
Microalb Creat Ratio: 14 mg/g{creat} (ref <30)
Microalb, Ur: 1.9 mg/dL

## 2024-04-21 LAB — HEMOGLOBIN A1C
Hgb A1c MFr Bld: 6.5 % — ABNORMAL HIGH (ref <5.7)
Mean Plasma Glucose: 140 mg/dL
eAG (mmol/L): 7.7 mmol/L

## 2024-07-11 ENCOUNTER — Encounter: Payer: Self-pay | Admitting: Infectious Diseases

## 2024-07-11 ENCOUNTER — Other Ambulatory Visit: Payer: Self-pay

## 2024-07-11 ENCOUNTER — Telehealth: Payer: Self-pay

## 2024-07-11 ENCOUNTER — Other Ambulatory Visit (HOSPITAL_COMMUNITY): Payer: Self-pay

## 2024-07-11 ENCOUNTER — Ambulatory Visit: Admitting: Infectious Diseases

## 2024-07-11 VITALS — BP 120/84 | HR 55 | Temp 98.0°F | Resp 16 | Wt 192.5 lb

## 2024-07-11 DIAGNOSIS — E119 Type 2 diabetes mellitus without complications: Secondary | ICD-10-CM | POA: Diagnosis not present

## 2024-07-11 DIAGNOSIS — B20 Human immunodeficiency virus [HIV] disease: Secondary | ICD-10-CM

## 2024-07-11 DIAGNOSIS — Z23 Encounter for immunization: Secondary | ICD-10-CM | POA: Diagnosis not present

## 2024-07-11 DIAGNOSIS — E785 Hyperlipidemia, unspecified: Secondary | ICD-10-CM | POA: Diagnosis not present

## 2024-07-11 MED ORDER — EMTRICITAB-RILPIVIR-TENOFOV AF 200-25-25 MG PO TABS: 1.0000 | ORAL_TABLET | Freq: Every day | ORAL | 11 refills | Status: AC

## 2024-07-11 MED ORDER — METFORMIN HCL 500 MG PO TABS: 500.0000 mg | ORAL_TABLET | Freq: Two times a day (BID) | ORAL | 2 refills | Status: AC

## 2024-07-11 MED ORDER — ROSUVASTATIN CALCIUM 10 MG PO TABS: 10.0000 mg | ORAL_TABLET | Freq: Every day | ORAL | 11 refills | Status: AC

## 2024-07-11 NOTE — Patient Instructions (Addendum)
  You need 3 medications -   Odefsey  - once a day with food like you have done   Metformin  - one tablet twice a day with food (new medication for diabetes)   Rosuvastatin  - once a day for cholesterol

## 2024-07-11 NOTE — Progress Notes (Signed)
 Name: Marcia Long  DOB: 03-22-1981 MRN: [id number] PCP: Celestia Rosaline SQUIBB, NP     Brief Narrative:  Marcia Long is a 43 y.o. female with HIV disease, dx 2006. Immigrated from [place] 07-2015 as refugee. Native language [place]. In care at [facility] ID clinic previously. Married, husband HIV+.  History of OIs: unknown.  Previous Regimens: Odefsey  2017 >> undetectable Tivicay  + Truvada during pregnancy 2019/2020 Tivicay  + Descovy  03-2019 Biktarvy  2022 >> rash Odefsey  2022  Genotypes: 2017 chart records indicate no major RT, PI, INSTI mutations  Subjective   Subjective:   Chief Complaint  Patient presents with   Follow-up    B20      Discussed the use of AI scribe software for clinical note transcription with the patient, who gave verbal consent to proceed. Phone translator for [employer]  Interpretation present for half the conversation then was disconnected w/o warming and unable to get back.   History of Present Illness   Marcia Long is a 43 year old female with HIV who presents for routine follow-up care.  She is here for routine follow-up for her HIV management. Her last visit was in June when her viral load was undetectable while on Odefsey  once daily. She reports that she is taking Odefsey , and frequent changes in her insurance coverage have made it difficult for her to access her medications.  She has a history of diabetes and was previously prescribed metformin , but has not been able to obtain it due to insurance issues. She has not had any fills of metformin  since her last visit. She is currently covered by [facility] with secondary Medicaid, and efforts are ongoing to resolve the medication access issue.  She mentions taking a cholesterol medication, but it is unclear if she is currently receiving it due to the same insurance complications.  Her social history includes a language barrier, as she speaks [place] and often  relies on her daughter for assistance, although she prefers to maintain privacy regarding her health matters. She has a large family, including six children, and her daughter is also a patient at the clinic.      Review of Systems  Respiratory: Negative.    Cardiovascular: Negative.   Gastrointestinal: Negative.   Genitourinary: Negative.   Musculoskeletal:  Negative for joint pain.  Skin:  Negative for rash.    Past Medical History:  Diagnosis Date   HIV (human immunodeficiency virus infection) (HCC)    HIV (human immunodeficiency virus infection) (HCC) 11/30/2018   Dx 2006 HLA neg Quantiferon neg Hep B sAg neg      Outpatient Medications Prior to Visit  Medication Sig Dispense Refill   acetaminophen  (TYLENOL ) 325 MG tablet Take 2 tablets (650 mg total) by mouth every 6 (six) hours as needed.     ibuprofen  (ADVIL ) 600 MG tablet Take 1 tablet (600 mg total) by mouth every 6 (six) hours as needed. 30 tablet 0   metoCLOPramide  (REGLAN ) 10 MG tablet Take 1 tablet (10 mg total) by mouth every 6 (six) hours as needed for nausea. 90 tablet 0   emtricitabine -rilpivir-tenofovir  AF (ODEFSEY ) 200-25-25 MG TABS tablet Take 1 tablet by mouth daily with breakfast. Try to take at the same time each day with a full meal 30 tablet 11   metFORMIN  (GLUCOPHAGE ) 500 MG tablet Take 1 tablet (500 mg total) by mouth 2 (two) times daily with a meal. 180 tablet 2   No facility-administered medications prior to visit.  Allergies  Allergen Reactions   Bictegravir Rash    Social History   Tobacco Use   Smoking status: Never    Passive exposure: Never   Smokeless tobacco: Never  Vaping Use   Vaping status: Never Used  Substance Use Topics   Alcohol use: Not Currently   Drug use: Not Currently    Social History   Substance and Sexual Activity  Sexual Activity Yes   Birth control/protection: None   Comment: declined condoms     Objective     Objective:   Vitals:   07/11/24 1033  07/11/24 1035  BP:  120/84  Pulse:  (!) 55  Resp:  16  Temp:  98 F (36.7 C)  TempSrc:  Oral  SpO2:  99%  Weight: 192 lb 8 oz (87.3 kg)    Body mass index is 33.04 kg/m.    Physical Exam Constitutional:      Appearance: Normal appearance. She is not ill-appearing.  HENT:     Mouth/Throat:     Mouth: Mucous membranes are moist.     Pharynx: Oropharynx is clear.  Eyes:     General: No scleral icterus. Cardiovascular:     Rate and Rhythm: Normal rate.  Pulmonary:     Effort: Pulmonary effort is normal.  Neurological:     Mental Status: She is oriented to person, place, and time.  Psychiatric:        Mood and Affect: Mood normal.        Thought Content: Thought content normal.      Lab Results Lab Results  Component Value Date   WBC 4.2 06/17/2023   HGB 12.1 06/17/2023   HCT 36.7 06/17/2023   MCV 87.0 06/17/2023   PLT 261 06/17/2023    Lab Results  Component Value Date   CREATININE 0.79 04/18/2024   BUN 12 04/18/2024   NA 139 04/18/2024   K 3.9 04/18/2024   CL 106 04/18/2024   CO2 27 04/18/2024    Lab Results  Component Value Date   ALT 15 04/18/2024   AST 11 04/18/2024   ALKPHOS 63 03/11/2022   BILITOT 0.4 04/18/2024    Lab Results  Component Value Date   CHOL 156 01/05/2023   HDL 62 01/05/2023   LDLCALC 80 01/05/2023   TRIG 64 01/05/2023   CHOLHDL 2.5 01/05/2023   HIV 1 RNA Quant  Date Value  04/18/2024 NOT DETECTED copies/mL  10/14/2023 Not Detected Copies/mL  06/17/2023 29 Copies/mL (H)   CD4 T Cell Abs (/uL)  Date Value  10/14/2023 1,036  06/17/2023 919  01/05/2023 675       Assessment & Plan:     HIV infection HIV infection is well-controlled with an undetectable viral load. Currently on Odefsey  once daily. Insurance issues have been a barrier to medication access, but she is currently receiving Odefsey  without issue. - Ensure continued access to Odefsey  through [employer] with a copay card. - Assist with setting  up automatic refills for Odefsey  at [facility] on [address] [place].  Type 2 diabetes mellitus, not on medication Type 2 diabetes mellitus is currently not managed with medication due to insurance and pharmacy access issues. - Send prescription for metformin  to [facility] on [address] for a 90-day supply. - Coordinate with pharmacy to ensure medication is filled and accessible.  Hyperlipidemia, not on medication Hyperlipidemia is currently not managed with medication due to similar issues with insurance and pharmacy access. - Send prescription for cholesterol medication to [facility] on  [address] for a 90-day supply. - Coordinate with pharmacy to ensure medication is filled and accessible.  General Health Maintenance Flu vaccination is due. - Administer flu shot during today's visit.  Recording duration: 19 minutes          Meds ordered this encounter  Medications   metFORMIN  (GLUCOPHAGE ) 500 MG tablet    Sig: Take 1 tablet (500 mg total) by mouth 2 (two) times daily with a meal.    Dispense:  180 tablet    Refill:  2    Patient to pick up - only speaks [place]   emtricitabine -rilpivir-tenofovir  AF (ODEFSEY ) 200-25-25 MG TABS tablet    Sig: Take 1 tablet by mouth daily with breakfast. Try to take at the same time each day with a full meal    Dispense:  30 tablet    Refill:  11    Patient to pick up - Only speaks [place].    Prescription Type::   Renewal   rosuvastatin  (CRESTOR ) 10 MG tablet    Sig: Take 1 tablet (10 mg total) by mouth daily.    Dispense:  30 tablet    Refill:  11    Patient to pick up - speaks [place] only   Orders Placed This Encounter  Procedures   Flu vaccine trivalent PF, 6mos and older(Flulaval,Afluria,Fluarix,Fluzone)    Corean Fireman, MSN, NP-C [facility] for Infectious Disease [facility] Pager: [phone] Office: [phone]   07/11/24  2:31 PM

## 2024-07-11 NOTE — Telephone Encounter (Signed)
 [facility] Patient Advocate Encounter   Was successful in obtaining a [employer] copay card for ODEFSEY .  This copay card will make the patients copay $0.  I have spoken with the patient.    The billing information is as follows and has been shared with [facility].  RxBin: [id number] PCN: [id number] Member ID: [id number] Group ID: [id number]    Charmaine Sharps, CPhT Specialty Pharmacy Patient [facility] for Infectious Disease Phone: [phone] Fax:  [phone]

## 2024-08-09 ENCOUNTER — Other Ambulatory Visit: Payer: Self-pay | Admitting: Infectious Diseases

## 2024-08-09 DIAGNOSIS — B20 Human immunodeficiency virus [HIV] disease: Secondary | ICD-10-CM

## 2024-10-12 ENCOUNTER — Ambulatory Visit: Admitting: Infectious Diseases

## 2024-10-12 ENCOUNTER — Other Ambulatory Visit: Payer: Self-pay

## 2024-10-12 ENCOUNTER — Other Ambulatory Visit (HOSPITAL_COMMUNITY)
Admission: RE | Admit: 2024-10-12 | Discharge: 2024-10-12 | Disposition: A | Discharge status: Home / Self Care | Source: Ambulatory Visit | Attending: Infectious Diseases | Admitting: Infectious Diseases

## 2024-10-12 DIAGNOSIS — Z23 Encounter for immunization: Secondary | ICD-10-CM

## 2024-10-12 DIAGNOSIS — Z7984 Long term (current) use of oral hypoglycemic drugs: Secondary | ICD-10-CM | POA: Diagnosis not present

## 2024-10-12 DIAGNOSIS — Z124 Encounter for screening for malignant neoplasm of cervix: Secondary | ICD-10-CM | POA: Diagnosis present

## 2024-10-12 DIAGNOSIS — B20 Human immunodeficiency virus [HIV] disease: Secondary | ICD-10-CM

## 2024-10-12 DIAGNOSIS — E119 Type 2 diabetes mellitus without complications: Secondary | ICD-10-CM | POA: Diagnosis not present

## 2024-10-12 NOTE — Patient Instructions (Addendum)
 No changes to your medications - please continue:   Odefsey  - everyday with food   Crestor  - 1 pill once a day   Metformin  - 1 pill twice a day with a meal (safe to take with Odefsey )

## 2024-10-12 NOTE — Progress Notes (Signed)
 Name: Marcia Long  DOB: Oct 19, 1981 MRN: [id number] PCP: Celestia Rosaline SQUIBB, NP     Brief Narrative:  Marcia Long is a 43 y.o. female with HIV disease, dx 2006. Immigrated from [place] 07-2015 as refugee. Native language [place]. In care at [facility] ID clinic previously. Married, husband HIV+.  History of OIs: unknown.  Previous Regimens: Odefsey  2017 >> undetectable Tivicay  + Truvada during pregnancy 2019/2020 Tivicay  + Descovy  03-2019 Biktarvy  2022 >> rash Odefsey  2022  Genotypes: 2017 chart records indicate no major RT, PI, INSTI mutations  Subjective   Subjective:   CC: follow up and pap smear   History of Present Illness   Marcia Long is a 43 year old female with HIV and diabetes who presents for follow-up care and Pap smear.  She is here for follow-up care regarding her HIV management. Her last viral load was undetectable in June, and her CD4 count was over 500. She continues to take Odefsey  with food daily and receives this medication via mail.  She is also being managed for diabetes and was started on metformin . She has experienced difficulty obtaining this medication, but it is noted that she is now receiving it. Her last A1c was 6.5% with diet management alone.  She is due for a Pap smear. Additionally, she is due for her COVID booster and the last dose of the pneumonia vaccine series.      Review of Systems  Respiratory: Negative.    Cardiovascular: Negative.   Gastrointestinal: Negative.   Genitourinary: Negative.   Musculoskeletal:  Negative for joint pain.  Skin:  Negative for rash.    Past Medical History:  Diagnosis Date   HIV (human immunodeficiency virus infection) (HCC)    HIV (human immunodeficiency virus infection) (HCC) 11/30/2018   Dx 2006 HLA neg Quantiferon neg Hep B sAg neg      Outpatient Medications Prior to Visit  Medication Sig Dispense Refill   acetaminophen  (TYLENOL ) 325 MG tablet Take 2 tablets  (650 mg total) by mouth every 6 (six) hours as needed.     emtricitabine -rilpivir-tenofovir  AF (ODEFSEY ) 200-25-25 MG TABS tablet Take 1 tablet by mouth daily with breakfast. Try to take at the same time each day with a full meal 30 tablet 11   ibuprofen  (ADVIL ) 600 MG tablet Take 1 tablet (600 mg total) by mouth every 6 (six) hours as needed. 30 tablet 0   metFORMIN  (GLUCOPHAGE ) 500 MG tablet Take 1 tablet (500 mg total) by mouth 2 (two) times daily with a meal. 180 tablet 2   metoCLOPramide  (REGLAN ) 10 MG tablet Take 1 tablet (10 mg total) by mouth every 6 (six) hours as needed for nausea. 90 tablet 0   rosuvastatin  (CRESTOR ) 10 MG tablet Take 1 tablet (10 mg total) by mouth daily. 30 tablet 11   No facility-administered medications prior to visit.     Allergies  Allergen Reactions   Bictegravir Rash    Social History   Tobacco Use   Smoking status: Never    Passive exposure: Never   Smokeless tobacco: Never  Vaping Use   Vaping status: Never Used  Substance Use Topics   Alcohol use: Not Currently   Drug use: Not Currently    Social History   Substance and Sexual Activity  Sexual Activity Yes   Birth control/protection: None   Comment: declined condoms     Objective     Objective:   There were no vitals filed for this visit.  There is no height or weight on file to calculate BMI.    Physical Exam Exam conducted with a chaperone present.  Genitourinary:    General: Normal vulva.     Vagina: Normal.     Cervix: Normal. No cervical motion tenderness, discharge or erythema.      Lab Results Lab Results  Component Value Date   WBC 4.2 06/17/2023   HGB 12.1 06/17/2023   HCT 36.7 06/17/2023   MCV 87.0 06/17/2023   PLT 261 06/17/2023    Lab Results  Component Value Date   CREATININE 0.79 04/18/2024   BUN 12 04/18/2024   NA 139 04/18/2024   K 3.9 04/18/2024   CL 106 04/18/2024   CO2 27 04/18/2024    Lab Results  Component Value Date   ALT 15  04/18/2024   AST 11 04/18/2024   ALKPHOS 63 03/11/2022   BILITOT 0.4 04/18/2024    Lab Results  Component Value Date   CHOL 156 01/05/2023   HDL 62 01/05/2023   LDLCALC 80 01/05/2023   TRIG 64 01/05/2023   CHOLHDL 2.5 01/05/2023   HIV 1 RNA Quant  Date Value  04/18/2024 NOT DETECTED copies/mL  10/14/2023 Not Detected Copies/mL  06/17/2023 29 Copies/mL (H)   CD4 T Cell Abs (/uL)  Date Value  10/14/2023 1,036  06/17/2023 919  01/05/2023 675       Assessment & Plan:     Screening for cervical cancer - Routine cervical cancer screening is due. Pap smear is necessary to detect any cellular changes on the cervix that could indicate a risk for developing cancer. - pelvic exam normal today  - no symptoms suggestive of perimenopause or menses interruptions.   Human immunodeficiency virus (HIV) disease- HIV is well-controlled with an undetectable viral load as of June and a CD4 count over 500. She continues on Odefsey  with food daily and receives medication via mail. - Continue Odefsey  with food daily - Ordered updated labs to monitor HIV status  Type 2 diabetes mellitus - Managed with metformin . Last A1c was 6.5 with diet management alone. There have been issues with obtaining metformin  from the pharmacy, but she has refills available. A metabolic panel is needed to evaluate liver function after starting metformin . - Continue metformin  - Ordered metabolic panel to evaluate liver function - Ensure pharmacy provides metformin          No orders of the defined types were placed in this encounter.  Orders Placed This Encounter  Procedures   Pfizer Comirnaty Covid-19 Vaccine 52yrs & older   Pneumococcal conjugate vaccine 20-valent   HIV 1 RNA quant-no reflex-bld   T-helper cells (CD4) count   Hemoglobin A1c   COMPLETE METABOLIC PANEL WITHOUT GFR    Corean Fireman, MSN, NP-C [facility] for Infectious Disease [facility] Pager: [phone] Office:  [phone]   10/12/2024  11:39 AM

## 2024-10-13 LAB — T-HELPER CELLS (CD4) COUNT (NOT AT ARMC)
CD4 % Helper T Cell: 46 % (ref 33–65)
CD4 T Cell Abs: 1129 /uL (ref 400–1790)

## 2024-10-14 LAB — COMPLETE METABOLIC PANEL WITHOUT GFR
AG Ratio: 1.7 (calc) (ref 1.0–2.5)
ALT: 18 U/L (ref 6–29)
AST: 9 U/L — ABNORMAL LOW (ref 10–30)
Albumin: 4.2 g/dL (ref 3.6–5.1)
Alkaline phosphatase (APISO): 52 U/L (ref 31–125)
BUN: 14 mg/dL (ref 7–25)
CO2: 29 mmol/L (ref 20–32)
Calcium: 9.2 mg/dL (ref 8.6–10.2)
Chloride: 105 mmol/L (ref 98–110)
Creat: 0.82 mg/dL (ref 0.50–0.99)
Globulin: 2.5 g/dL (ref 1.9–3.7)
Glucose, Bld: 102 mg/dL — ABNORMAL HIGH (ref 65–99)
Potassium: 4 mmol/L (ref 3.5–5.3)
Sodium: 141 mmol/L (ref 135–146)
Total Bilirubin: 0.4 mg/dL (ref 0.2–1.2)
Total Protein: 6.7 g/dL (ref 6.1–8.1)

## 2024-10-14 LAB — HIV-1 RNA QUANT-NO REFLEX-BLD
HIV 1 RNA Quant: <20 {copies}/mL — AB
HIV-1 RNA Quant, Log: <1.3 {Log_copies}/mL — AB

## 2024-10-14 LAB — HEMOGLOBIN A1C
Hgb A1c MFr Bld: 6.5 % — ABNORMAL HIGH
Mean Plasma Glucose: 140 mg/dL
eAG (mmol/L): 7.7 mmol/L

## 2024-10-16 ENCOUNTER — Ambulatory Visit: Payer: Self-pay | Admitting: Infectious Diseases

## 2024-10-16 LAB — CYTOLOGY - PAP
Adequacy: ABSENT
Diagnosis: NEGATIVE

## 2024-11-06 NOTE — Progress Notes (Signed)
 10-YEAR ASCVD RISK: 1%  Sex: Female Race: African American  Age: 44 Total Cholesterol (mg/dL) 843 HDL Cholesterol (mg/dL) 62 LDL Cholesterol (mg/dL) 80 Systolic Blood Pressure (mm Hg) 120 Diastolic Blood Pressure (mm Hg) 84 Diabetes: Yes Smoker: Never Treatment for Hypertension: No Aspirin Therapy: No Statin: Yes  Currently prescribed rosuvastatin  10 mg. NEVER FILLED.  Marcia Long, BSN, RN

## 2024-11-28 ENCOUNTER — Other Ambulatory Visit (HOSPITAL_COMMUNITY): Payer: Self-pay

## 2025-02-20 ENCOUNTER — Ambulatory Visit: Payer: Self-pay | Admitting: Infectious Diseases
